# Patient Record
Sex: Female | Born: 1965 | Hispanic: No | Marital: Married | Smoking: Never smoker
Health system: Southern US, Community
[De-identification: ages and names within clinical notes are randomized; demographics above are authoritative.]

## PROBLEM LIST (undated history)

## (undated) DIAGNOSIS — E079 Disorder of thyroid, unspecified: Secondary | ICD-10-CM

## (undated) DIAGNOSIS — T7840XA Allergy, unspecified, initial encounter: Secondary | ICD-10-CM

## (undated) DIAGNOSIS — Z955 Presence of coronary angioplasty implant and graft: Secondary | ICD-10-CM

## (undated) DIAGNOSIS — I2510 Atherosclerotic heart disease of native coronary artery without angina pectoris: Secondary | ICD-10-CM

## (undated) HISTORY — DX: Presence of coronary angioplasty implant and graft: Z95.5

## (undated) HISTORY — DX: Allergy, unspecified, initial encounter: T78.40XA

## (undated) HISTORY — DX: Disorder of thyroid, unspecified: E07.9

---

## 1997-12-31 ENCOUNTER — Ambulatory Visit (HOSPITAL_COMMUNITY): Admission: RE | Admit: 1997-12-31 | Discharge: 1997-12-31 | Payer: Self-pay | Admitting: Obstetrics and Gynecology

## 1998-01-10 ENCOUNTER — Inpatient Hospital Stay (HOSPITAL_COMMUNITY): Admission: AD | Admit: 1998-01-10 | Discharge: 1998-01-10 | Payer: Self-pay | Admitting: *Deleted

## 1998-01-28 ENCOUNTER — Ambulatory Visit (HOSPITAL_COMMUNITY): Admission: RE | Admit: 1998-01-28 | Discharge: 1998-01-28 | Payer: Self-pay | Admitting: Obstetrics and Gynecology

## 1999-01-20 ENCOUNTER — Inpatient Hospital Stay (HOSPITAL_COMMUNITY): Admission: AD | Admit: 1999-01-20 | Discharge: 1999-01-23 | Payer: Self-pay | Admitting: Obstetrics and Gynecology

## 1999-01-25 ENCOUNTER — Encounter (HOSPITAL_COMMUNITY): Admission: RE | Admit: 1999-01-25 | Discharge: 1999-04-25 | Payer: Self-pay | Admitting: Obstetrics and Gynecology

## 1999-02-28 ENCOUNTER — Other Ambulatory Visit: Admission: RE | Admit: 1999-02-28 | Discharge: 1999-02-28 | Payer: Self-pay | Admitting: Obstetrics and Gynecology

## 2000-03-20 ENCOUNTER — Other Ambulatory Visit: Admission: RE | Admit: 2000-03-20 | Discharge: 2000-03-20 | Payer: Self-pay | Admitting: Obstetrics and Gynecology

## 2000-10-24 ENCOUNTER — Other Ambulatory Visit: Admission: RE | Admit: 2000-10-24 | Discharge: 2000-10-24 | Payer: Self-pay | Admitting: Obstetrics and Gynecology

## 2001-05-16 ENCOUNTER — Inpatient Hospital Stay (HOSPITAL_COMMUNITY): Admission: AD | Admit: 2001-05-16 | Discharge: 2001-05-18 | Payer: Self-pay | Admitting: Obstetrics and Gynecology

## 2001-06-11 ENCOUNTER — Other Ambulatory Visit: Admission: RE | Admit: 2001-06-11 | Discharge: 2001-06-11 | Payer: Self-pay | Admitting: Obstetrics and Gynecology

## 2011-10-20 DIAGNOSIS — E039 Hypothyroidism, unspecified: Secondary | ICD-10-CM

## 2011-10-20 HISTORY — DX: Hypothyroidism, unspecified: E03.9

## 2014-08-12 DIAGNOSIS — J3089 Other allergic rhinitis: Secondary | ICD-10-CM

## 2014-08-12 HISTORY — DX: Other allergic rhinitis: J30.89

## 2015-08-23 DIAGNOSIS — Z0000 Encounter for general adult medical examination without abnormal findings: Secondary | ICD-10-CM | POA: Insufficient documentation

## 2015-08-23 HISTORY — DX: Encounter for general adult medical examination without abnormal findings: Z00.00

## 2015-08-23 LAB — CBC AND DIFFERENTIAL
HCT: 37 (ref 36–46)
HEMOGLOBIN: 12.1 (ref 12.0–16.0)
PLATELETS: 236 (ref 150–399)
WBC: 6.1

## 2015-08-23 LAB — BASIC METABOLIC PANEL
BUN: 12 (ref 4–21)
Creatinine: 0.7 (ref 0.5–1.1)
Glucose: 87
POTASSIUM: 3.8 (ref 3.4–5.3)
Sodium: 137 (ref 137–147)

## 2015-08-23 LAB — HEPATIC FUNCTION PANEL
ALT: 22 (ref 7–35)
AST: 23 (ref 13–35)
Alkaline Phosphatase: 86 (ref 25–125)

## 2015-08-23 LAB — LIPID PANEL
CHOLESTEROL: 164 (ref 0–200)
HDL: 56 (ref 35–70)
LDL Cholesterol: 88
TRIGLYCERIDES: 100 (ref 40–160)

## 2015-08-23 LAB — TSH: TSH: 4.18 (ref 0.41–5.90)

## 2016-10-11 LAB — BASIC METABOLIC PANEL
BUN: 15 (ref 4–21)
Creatinine: 0.7 (ref 0.5–1.1)
GLUCOSE: 87
Potassium: 4.5 (ref 3.4–5.3)
SODIUM: 142 (ref 137–147)

## 2016-10-11 LAB — LIPID PANEL
Cholesterol: 188 (ref 0–200)
HDL: 60 (ref 35–70)
LDL CALC: 115
Triglycerides: 63 (ref 40–160)

## 2016-10-11 LAB — CBC AND DIFFERENTIAL
HEMATOCRIT: 39 (ref 36–46)
Hemoglobin: 12.9 (ref 12.0–16.0)
Platelets: 231 (ref 150–399)
WBC: 5.7

## 2016-10-11 LAB — HEPATIC FUNCTION PANEL
ALK PHOS: 82 (ref 25–125)
ALT: 15 (ref 7–35)
AST: 26 (ref 13–35)
BILIRUBIN, TOTAL: 0.2

## 2016-10-11 LAB — TSH: TSH: 9.22 — AB (ref 0.41–5.90)

## 2017-02-14 LAB — TSH: TSH: 3.32 (ref 0.41–5.90)

## 2017-07-23 ENCOUNTER — Ambulatory Visit: Payer: Federal, State, Local not specified - PPO | Admitting: Family Medicine

## 2017-07-23 ENCOUNTER — Encounter: Payer: Self-pay | Admitting: Family Medicine

## 2017-07-23 VITALS — BP 126/80 | HR 89 | Temp 97.9°F | Ht 61.0 in | Wt 168.2 lb

## 2017-07-23 DIAGNOSIS — Z0000 Encounter for general adult medical examination without abnormal findings: Secondary | ICD-10-CM | POA: Diagnosis not present

## 2017-07-23 DIAGNOSIS — J301 Allergic rhinitis due to pollen: Secondary | ICD-10-CM

## 2017-07-23 DIAGNOSIS — E039 Hypothyroidism, unspecified: Secondary | ICD-10-CM

## 2017-07-23 DIAGNOSIS — R0789 Other chest pain: Secondary | ICD-10-CM | POA: Insufficient documentation

## 2017-07-23 HISTORY — DX: Other chest pain: R07.89

## 2017-07-23 HISTORY — DX: Allergic rhinitis due to pollen: J30.1

## 2017-07-23 MED ORDER — PREDNISONE 20 MG PO TABS: 20.0000 mg | ORAL_TABLET | Freq: Two times a day (BID) | ORAL | 0 refills | Status: DC

## 2017-07-23 MED ORDER — FLUTICASONE PROPIONATE 50 MCG/ACT NA SUSP: 2.0000 | Freq: Every day | NASAL | 6 refills | Status: DC

## 2017-07-23 NOTE — Progress Notes (Signed)
Subjective:  Patient ID: Maria Myers, female    DOB: 08-11-1965  Age: 52 y.o. MRN: [id number]  CC: Establish Care   HPI [name] presents for establishment of care and for evaluation of itchy watery eyes, sneezing runny nose, postnasal drip and scratchy throat.  She has a history of allergy rhinitis.  It sounds as though she has taken desensitization therapy for this that has been helpful in the past.  He is nonfasting today.  She is married and has 3 children.  One child is grown and out of the house.  Her 50 year old daughter is meeting a lifestyle that is causing her stress.  Patient tells me that she has had nonexertional bouts of superior chest pressure lasting a few minutes.  These seem to be relieved when she meditates and practices yoga.  There has been no nausea or diaphoresis or exertional exacerbation.  She has no history of cardiac disease.  Both of her parents are in their 85s and still right their bicycles.  Patient does not smoke she is not exposed to cigarette smoke.  Chart review of her lipid profile drawn back in May of last year shows an LDL of 115 and an HDL of 60.  Patient works for the [employer].  She does not smoke or use illicit drugs.  She is planning on starting exercise soon.  Her female health care is through GYN.  She will need me to refer her to 1.  History Juliane has a past medical history of Allergy and Thyroid disease.   She has a past surgical history that includes Cesarean section.   Her family history includes Arthritis in her sister.She reports that  has never smoked. she has never used smokeless tobacco. She reports that she does not use drugs. Her alcohol history is not on file.  Outpatient Medications Prior to Visit  Medication Sig Dispense Refill  . levothyroxine (SYNTHROID, LEVOTHROID) 50 MCG tablet Take 1 tablet by mouth every morning.  11   No facility-administered medications prior to visit.     ROS Review of Systems  Constitutional: Negative  for chills, diaphoresis, fatigue, fever and unexpected weight change.  HENT: Positive for congestion, postnasal drip, rhinorrhea and sneezing. Negative for sinus pressure, sinus pain, sore throat and trouble swallowing.   Eyes: Positive for discharge and itching. Negative for photophobia and visual disturbance.  Respiratory: Negative.  Negative for chest tightness, shortness of breath and wheezing.   Cardiovascular: Negative for chest pain and palpitations.  Gastrointestinal: Negative.   Endocrine: Negative for polyphagia and polyuria.  Genitourinary: Negative.   Musculoskeletal: Negative for arthralgias and joint swelling.  Skin: Negative for color change and rash.  Allergic/Immunologic: Negative for immunocompromised state.  Neurological: Negative for weakness and headaches.  Hematological: Does not bruise/bleed easily.    Objective:  BP 126/80 (BP Location: Right Arm, Patient Position: Sitting, Cuff Size: Normal)   Pulse 89   Temp 97.9 F (36.6 C) (Oral)   Ht 5\' 1"  (1.549 m)   Wt 168 lb 4 oz (76.3 kg)   LMP  (LMP Unknown)   SpO2 98%   BMI 31.79 kg/m   Physical Exam  Constitutional: She is oriented to person, place, and time. She appears well-developed. No distress.  HENT:  Head: Normocephalic and atraumatic.  Right Ear: External ear normal.  Mouth/Throat: Oropharynx is clear and moist. No oropharyngeal exudate.  Eyes: Pupils are equal, round, and reactive to light. Right eye exhibits discharge. Right eye exhibits no chemosis. Left  eye exhibits discharge. Left eye exhibits no chemosis. No scleral icterus.    Neck: Neck supple. No JVD present. No tracheal deviation present. No thyromegaly present.  Cardiovascular: Normal rate, regular rhythm and normal heart sounds.  Pulmonary/Chest: Effort normal and breath sounds normal. No respiratory distress. She has no wheezes. She has no rales.  Lymphadenopathy:    She has no cervical adenopathy.  Neurological: She is alert and  oriented to person, place, and time.  Skin: Skin is warm and dry. No rash noted. She is not diaphoretic.  Psychiatric: She has a normal mood and affect. Her behavior is normal.      Assessment & Plan:   Veeda was seen today for establish care.  Diagnoses and all orders for this visit:  Chest pressure -     EKG 12-Lead -     CBC; Future -     Comprehensive metabolic panel; Future  Seasonal allergic rhinitis due to pollen -     predniSONE (DELTASONE) 20 MG tablet; Take 1 tablet (20 mg total) by mouth 2 (two) times daily with a meal for 7 days. -     fluticasone (FLONASE) 50 MCG/ACT nasal spray; Place 2 sprays into both nostrils daily. -     CBC; Future -     Comprehensive metabolic panel; Future  Acquired hypothyroidism -     Lipid panel; Future -     TSH; Future  Healthcare maintenance -     CBC; Future -     Comprehensive metabolic panel; Future -     HIV antibody; Future -     Lipid panel; Future -     Urinalysis, Routine w reflex microscopic; Future  Patient presents today with symptoms consistent with allergy rhinitis.  The chest pressure she has reported is atypical.  She is not high risk for cardiovascular disease with her known lipid profile and her history.  She will resume in the next few days for fasting labs.  I will soon see her back in a few weeks to reassess further.  Her EKG was showed one flipped T and was otherwise nonspecific. I am having Tabrina Gora start on predniSONE and fluticasone. I am also having her maintain her levothyroxine.  Meds ordered this encounter  Medications  . predniSONE (DELTASONE) 20 MG tablet    Sig: Take 1 tablet (20 mg total) by mouth 2 (two) times daily with a meal for 7 days.    Dispense:  14 tablet    Refill:  0  . fluticasone (FLONASE) 50 MCG/ACT nasal spray    Sig: Place 2 sprays into both nostrils daily.    Dispense:  16 g    Refill:  6     Follow-up: Return in about 2 weeks (around 08/06/2017), or if symptoms worsen or  fail to improve.  Mliss SaxWilliam Alfred Afia Messenger, MD

## 2017-07-23 NOTE — Patient Instructions (Addendum)

## 2017-07-27 ENCOUNTER — Emergency Department (HOSPITAL_COMMUNITY): Payer: Federal, State, Local not specified - PPO

## 2017-07-27 ENCOUNTER — Emergency Department (HOSPITAL_COMMUNITY)
Admission: EM | Admit: 2017-07-27 | Discharge: 2017-07-27 | Disposition: A | Discharge status: Home / Self Care | Payer: Federal, State, Local not specified - PPO | Attending: Emergency Medicine | Admitting: Emergency Medicine

## 2017-07-27 ENCOUNTER — Encounter (HOSPITAL_COMMUNITY): Payer: Self-pay | Admitting: *Deleted

## 2017-07-27 ENCOUNTER — Other Ambulatory Visit: Payer: Self-pay

## 2017-07-27 DIAGNOSIS — M542 Cervicalgia: Secondary | ICD-10-CM | POA: Insufficient documentation

## 2017-07-27 DIAGNOSIS — Z79899 Other long term (current) drug therapy: Secondary | ICD-10-CM | POA: Insufficient documentation

## 2017-07-27 DIAGNOSIS — R079 Chest pain, unspecified: Secondary | ICD-10-CM | POA: Diagnosis present

## 2017-07-27 DIAGNOSIS — J069 Acute upper respiratory infection, unspecified: Secondary | ICD-10-CM | POA: Diagnosis not present

## 2017-07-27 LAB — CBC
HEMATOCRIT: 39.8 % (ref 36.0–46.0)
HEMOGLOBIN: 12.9 g/dL (ref 12.0–15.0)
MCH: 27.8 pg (ref 26.0–34.0)
MCHC: 32.4 g/dL (ref 30.0–36.0)
MCV: 85.8 fL (ref 78.0–100.0)
Platelets: 233 10*3/uL (ref 150–400)
RBC: 4.64 MIL/uL (ref 3.87–5.11)
RDW: 13.5 % (ref 11.5–15.5)
WBC: 7.5 10*3/uL (ref 4.0–10.5)

## 2017-07-27 LAB — BASIC METABOLIC PANEL
Anion gap: 11 (ref 5–15)
BUN: 13 mg/dL (ref 6–20)
CHLORIDE: 103 mmol/L (ref 101–111)
CO2: 24 mmol/L (ref 22–32)
Calcium: 8.9 mg/dL (ref 8.9–10.3)
Creatinine, Ser: 0.73 mg/dL (ref 0.44–1.00)
GFR calc non Af Amer: >60 mL/min (ref >60)
Glucose, Bld: 116 mg/dL — ABNORMAL HIGH (ref 65–99)
POTASSIUM: 3.5 mmol/L (ref 3.5–5.1)
Sodium: 138 mmol/L (ref 135–145)

## 2017-07-27 LAB — I-STAT BETA HCG BLOOD, ED (MC, WL, AP ONLY)

## 2017-07-27 LAB — I-STAT TROPONIN, ED: Troponin i, poc: 0.01 ng/mL (ref 0.00–0.08)

## 2017-07-27 MED ORDER — AMOXICILLIN 500 MG PO CAPS: 500.0000 mg | ORAL_CAPSULE | Freq: Three times a day (TID) | ORAL | 0 refills | Status: DC

## 2017-07-27 MED ORDER — ALBUTEROL SULFATE HFA 108 (90 BASE) MCG/ACT IN AERS: 2.0000 | INHALATION_SPRAY | RESPIRATORY_TRACT | 1 refills | Status: AC | PRN

## 2017-07-27 MED ORDER — IPRATROPIUM-ALBUTEROL 0.5-2.5 (3) MG/3ML IN SOLN
3.0000 mL | Freq: Once | RESPIRATORY_TRACT | Status: AC
  Administered 2017-07-27: 3 mL via RESPIRATORY_TRACT
  Filled 2017-07-27: qty 3

## 2017-07-27 MED ORDER — PREDNISONE 50 MG PO TABS: ORAL_TABLET | ORAL | 0 refills | Status: DC

## 2017-07-27 NOTE — ED Triage Notes (Signed)
Pt in c/o central chest pain that radiates into her neck, intermittent over the last few days, increased episodes this morning, states it is relieved by rest and relaxing, no distress at this time

## 2017-07-27 NOTE — ED Provider Notes (Signed)
[facility] EMERGENCY DEPARTMENT Provider Note   CSN: [id number] Arrival date & time: 07/27/17  4098     History   Chief Complaint Chief Complaint  Patient presents with  . Chest Pain    HPI Maria Myers is a 52 y.o. female.  The history is provided by the patient. No language interpreter was used.  Chest Pain   This is a new problem. The current episode started more than 2 days ago. The problem occurs constantly. The problem has not changed since onset.The pain is associated with rest. The pain is moderate. The pain radiates to the left neck and right neck. Pertinent negatives include no cough, no headaches and no nausea. She has tried nothing for the symptoms. The treatment provided no relief. There are no known risk factors.  Her past medical history is significant for thyroid problem.  Procedure history is negative for cardiac catheterization.  Pt complains of tightness in her upper chest and her neck.  Pt reports neck becomes very tight.  Pt reports upper chest feels tight.  Pt reports symptoms improve with rest.  Pt reports she has had sinus congestion and pressure.   Past Medical History:  Diagnosis Date  . Allergy   . Thyroid disease     Patient Active Problem List   Diagnosis Date Noted  . Chest pressure 07/23/2017  . Seasonal allergic rhinitis due to pollen 07/23/2017    Past Surgical History:  Procedure Laterality Date  . CESAREAN SECTION      OB History    No data available       Home Medications    Prior to Admission medications   Medication Sig Start Date End Date Taking? Authorizing Provider  fluticasone (FLONASE) 50 MCG/ACT nasal spray Place 2 sprays into both nostrils daily. 07/23/17  Yes Mliss Sax, MD  levothyroxine (SYNTHROID, LEVOTHROID) 50 MCG tablet Take 1 tablet by mouth every morning. 04/30/17  Yes [provider]  Multiple Vitamin (MULTIVITAMIN WITH MINERALS) TABS tablet Take 1 tablet by mouth daily.    Yes [provider]  predniSONE (DELTASONE) 20 MG tablet Take 1 tablet (20 mg total) by mouth 2 (two) times daily with a meal for 7 days. 07/23/17 07/30/17 Yes Mliss Sax, MD    Family History Family History  Problem Relation Age of Onset  . Arthritis Sister     Social History Social History   Tobacco Use  . Smoking status: Never Smoker  . Smokeless tobacco: Never Used  Substance Use Topics  . Alcohol use: Not on file  . Drug use: No     Allergies   Patient has no known allergies.   Review of Systems Review of Systems  Respiratory: Negative for cough.   Cardiovascular: Positive for chest pain.  Gastrointestinal: Negative for nausea.  Neurological: Negative for headaches.  All other systems reviewed and are negative.    Physical Exam Updated Vital Signs BP (!) 149/67 (BP Location: Right Arm)   Pulse 62   Temp 97.6 F (36.4 C) (Oral)   Resp 16   LMP  (LMP Unknown)   SpO2 100%   Physical Exam  Constitutional: She appears well-developed and well-nourished. No distress.  HENT:  Head: Normocephalic and atraumatic.  Eyes: Conjunctivae are normal. Pupils are equal, round, and reactive to light.  Neck: Neck supple.  Cardiovascular: Normal rate, regular rhythm and normal pulses.  No murmur heard. Pulmonary/Chest: Effort normal and breath sounds normal. No respiratory distress.  Abdominal: Soft.  There is no tenderness.  Musculoskeletal: She exhibits no edema.  Neurological: She is alert.  Skin: Skin is warm and dry.  Psychiatric: She has a normal mood and affect.  Nursing note and vitals reviewed.    ED Treatments / Results  Labs (all labs ordered are listed, but only abnormal results are displayed) Labs Reviewed  BASIC METABOLIC PANEL - Abnormal; Notable for the following components:      Result Value   Glucose, Bld 116 (*)    All other components within normal limits  CBC  I-STAT TROPONIN, ED  I-STAT BETA HCG BLOOD, ED ([facility], [facility], [facility]  ONLY)    EKG  EKG Interpretation  Date/Time:  Friday July 27 2017 08:33:39 EST Ventricular Rate:  63 PR Interval:  146 QRS Duration: 100 QT Interval:  388 QTC Calculation: 397 R Axis:   30 Text Interpretation:  Normal sinus rhythm Nonspecific T wave abnormality Abnormal ECG Confirmed by Jacalyn LefevreHaviland, Julie [phone]([id number]) on 07/27/2017 10:00:01 AM       Radiology Dg Chest 2 View  Result Date: 07/27/2017 CLINICAL DATA:  Intermittent chest pain for a few days. EXAM: CHEST  2 VIEW COMPARISON:  None. FINDINGS: The cardiomediastinal silhouette is within normal limits. The lungs are well inflated and clear. There is no evidence of pleural effusion or pneumothorax. No acute osseous abnormality is identified. IMPRESSION: No active cardiopulmonary disease. Electronically Signed   By: Sebastian AcheAllen  Grady M.D.   On: 07/27/2017 09:09    Procedures Procedures (including critical care time)  Medications Ordered in ED Medications  ipratropium-albuterol (DUONEB) 0.5-2.5 (3) MG/3ML nebulizer solution 3 mL (not administered)     Initial Impression / Assessment and Plan / ED Course  I have reviewed the triage vital signs and the nursing notes.  Pertinent labs & imaging results that were available during my care of the patient were reviewed by me and considered in my medical decision making (see chart for details).  Clinical Course as of Jul 27 1025  Fri Jul 27, 2017  0907 ED EKG within 10 minutes [LS]  0907 ED EKG within 10 minutes [LS]    Clinical Course User Index [LS] Elson AreasSofia, Leslie K, PA-C    MDM  Pt reports some improvement with albuterol/atrovent.  Pt reports tightness resolved.  I discussed with Dr. Particia NearingHaviland.  I will treat with prednisone, albuterol.  I am going to treat as well for possible sinus infection  Final Clinical Impressions(s) / ED Diagnoses   Final diagnoses:  Upper respiratory tract infection, unspecified type  Nonspecific chest pain    ED Discharge Orders        Ordered     albuterol (PROVENTIL HFA;VENTOLIN HFA) 108 (90 Base) MCG/ACT inhaler  Every 4 hours PRN     07/27/17 1147    predniSONE (DELTASONE) 50 MG tablet     07/27/17 1147    amoxicillin (AMOXIL) 500 MG capsule  3 times daily     07/27/17 1147    An After Visit Summary was printed and given to the patient.    Elson AreasSofia, Leslie K, [place]-C 07/27/17 1147    Jacalyn LefevreHaviland, Julie, MD 07/27/17 1159

## 2017-07-27 NOTE — ED Notes (Signed)
Pt verbalized understanding discharge instructions and denies any further needs or questions at this time. VS stable, ambulatory and steady gait.   

## 2017-07-27 NOTE — Discharge Instructions (Signed)
Return if any problems.  See your Physician for recheck.  Return if any problems.  

## 2017-07-27 NOTE — ED Triage Notes (Signed)
Pt describes pain as a pressure, states she feels pressure in her neck and head as well

## 2017-08-01 ENCOUNTER — Encounter: Payer: Self-pay | Admitting: Family Medicine

## 2017-08-01 ENCOUNTER — Ambulatory Visit: Payer: Federal, State, Local not specified - PPO | Admitting: Family Medicine

## 2017-08-01 VITALS — BP 130/70 | HR 49 | Wt 166.2 lb

## 2017-08-01 DIAGNOSIS — K219 Gastro-esophageal reflux disease without esophagitis: Secondary | ICD-10-CM

## 2017-08-01 DIAGNOSIS — E039 Hypothyroidism, unspecified: Secondary | ICD-10-CM | POA: Diagnosis not present

## 2017-08-01 DIAGNOSIS — R221 Localized swelling, mass and lump, neck: Secondary | ICD-10-CM | POA: Diagnosis not present

## 2017-08-01 DIAGNOSIS — Z0000 Encounter for general adult medical examination without abnormal findings: Secondary | ICD-10-CM

## 2017-08-01 DIAGNOSIS — R0789 Other chest pain: Secondary | ICD-10-CM | POA: Diagnosis not present

## 2017-08-01 DIAGNOSIS — J301 Allergic rhinitis due to pollen: Secondary | ICD-10-CM

## 2017-08-01 DIAGNOSIS — F341 Dysthymic disorder: Secondary | ICD-10-CM

## 2017-08-01 HISTORY — DX: Dysthymic disorder: F34.1

## 2017-08-01 HISTORY — DX: Localized swelling, mass and lump, neck: R22.1

## 2017-08-01 LAB — COMPREHENSIVE METABOLIC PANEL
ALT: 20 U/L (ref 0–35)
AST: 16 U/L (ref 0–37)
Albumin: 3.7 g/dL (ref 3.5–5.2)
Alkaline Phosphatase: 70 U/L (ref 39–117)
BILIRUBIN TOTAL: 0.4 mg/dL (ref 0.2–1.2)
BUN: 16 mg/dL (ref 6–23)
CO2: 29 meq/L (ref 19–32)
CREATININE: 0.7 mg/dL (ref 0.40–1.20)
Calcium: 8.9 mg/dL (ref 8.4–10.5)
Chloride: 101 mEq/L (ref 96–112)
GFR: 93.38 mL/min (ref >60.00)
GLUCOSE: 80 mg/dL (ref 70–99)
Potassium: 3.5 mEq/L (ref 3.5–5.1)
SODIUM: 137 meq/L (ref 135–145)
Total Protein: 6.8 g/dL (ref 6.0–8.3)

## 2017-08-01 LAB — CBC
HCT: 38.7 % (ref 36.0–46.0)
Hemoglobin: 13 g/dL (ref 12.0–15.0)
MCHC: 33.6 g/dL (ref 30.0–36.0)
MCV: 84.7 fl (ref 78.0–100.0)
PLATELETS: 217 10*3/uL (ref 150.0–400.0)
RBC: 4.57 Mil/uL (ref 3.87–5.11)
RDW: 13.8 % (ref 11.5–15.5)
WBC: 7.9 10*3/uL (ref 4.0–10.5)

## 2017-08-01 LAB — URINALYSIS, ROUTINE W REFLEX MICROSCOPIC
BILIRUBIN URINE: NEGATIVE
HGB URINE DIPSTICK: NEGATIVE
Ketones, ur: NEGATIVE
NITRITE: NEGATIVE
RBC / HPF: NONE SEEN (ref 0–?)
Specific Gravity, Urine: <=1.005 — AB (ref 1.000–1.030)
TOTAL PROTEIN, URINE-UPE24: NEGATIVE
URINE GLUCOSE: NEGATIVE
Urobilinogen, UA: 0.2 (ref 0.0–1.0)
pH: 6 (ref 5.0–8.0)

## 2017-08-01 LAB — LIPID PANEL
CHOL/HDL RATIO: 3
Cholesterol: 169 mg/dL (ref 0–200)
HDL: 62.3 mg/dL (ref >39.00)
LDL Cholesterol: 77 mg/dL (ref 0–99)
NonHDL: 106.55
Triglycerides: 147 mg/dL (ref 0.0–149.0)
VLDL: 29.4 mg/dL (ref 0.0–40.0)

## 2017-08-01 LAB — TSH: TSH: 7.62 u[IU]/mL — ABNORMAL HIGH (ref 0.35–4.50)

## 2017-08-01 MED ORDER — OMEPRAZOLE 20 MG PO TBDD
1.0000 | DELAYED_RELEASE_TABLET | Freq: Every day | ORAL | 2 refills | Status: DC
Start: 2017-08-01 — End: 2018-01-04

## 2017-08-01 NOTE — Progress Notes (Addendum)
Subjective:  Patient ID: Maria Myers, female    DOB: [birth year]  Age: 52 y.o. MRN: [id number]  CC: Hospitalization Follow-up   HPI Euretha Sardo presents for hospital follow-up status post ER visit or asthmatic bronchitis.  She was given albuterol inhaler, amoxicillin and continued on prednisone.  EKG taken at that time showed the same inverted T wave in the lateral chest.  Her troponin was negative.  Patient continues to feel tightness in her throat.  She is concerned about thyroid cancer.  No masses in her neck.  That are tender from time to time.  She notices some swelling in her throat when she eats apples.  She does admit to having some indigestion and is currently taking nothing for it.  Above medicines will help her cough.  Last visit she mentioned concerned about her oldest daughter.  I asked her today what was going on there.  Apparently this daughter had gone off to college and got involved with what sounds while alcohol and illicit drugs.  She did not finish college after 15 semesters.  She dropped out.  She has a living away from home and my patient has no idea how her daughter making her way.  She does not appear to be employed.  The patient well up in the tearful when she talked about this daughter.  Patient is fasting today for blood work.  She also talks about cold intolerance and difficulty losing weight.  I am not certain when her TSH was last checked.  She does claim compliance with her thyroid medicine.  Outpatient Medications Prior to Visit  Medication Sig Dispense Refill  . albuterol (PROVENTIL HFA;VENTOLIN HFA) 108 (90 Base) MCG/ACT inhaler Inhale 2 puffs into the lungs every 4 (four) hours as needed for wheezing or shortness of breath. 1 Inhaler 1  . amoxicillin (AMOXIL) 500 MG capsule Take 1 capsule (500 mg total) by mouth 3 (three) times daily. 30 capsule 0  . fluticasone (FLONASE) 50 MCG/ACT nasal spray Place 2 sprays into both nostrils daily. 16 g 6  . Multiple Vitamin  (MULTIVITAMIN WITH MINERALS) TABS tablet Take 1 tablet by mouth daily.    . predniSONE (DELTASONE) 50 MG tablet One  A day 5 tablet 0  . levothyroxine (SYNTHROID, LEVOTHROID) 50 MCG tablet Take 1 tablet by mouth every morning.  11   No facility-administered medications prior to visit.     ROS Review of Systems  Constitutional: Positive for fatigue. Negative for chills, fever and unexpected weight change.  HENT: Positive for congestion, postnasal drip, rhinorrhea, sneezing and sore throat. Negative for sinus pressure, sinus pain, trouble swallowing and voice change.   Eyes: Negative for photophobia and visual disturbance.  Respiratory: Positive for cough. Negative for chest tightness, shortness of breath and wheezing.   Cardiovascular: Negative for chest pain and palpitations.  Gastrointestinal: Negative.   Endocrine: Negative for polyphagia and polyuria.  Genitourinary: Negative.   Musculoskeletal: Negative for gait problem and myalgias.  Skin: Negative for rash and wound.  Allergic/Immunologic: Negative for immunocompromised state.  Neurological: Negative for light-headedness and headaches.  Hematological: Does not bruise/bleed easily.  Psychiatric/Behavioral: Positive for dysphoric mood. Negative for agitation, self-injury and suicidal ideas. The patient is nervous/anxious.     Objective:  BP 130/70 (BP Location: Right Arm, Patient Position: Sitting, Cuff Size: Normal)   Pulse (!) 49   Wt 166 lb 3.2 oz (75.4 kg)   LMP  (LMP Unknown)   BMI 31.40 kg/m   BP Readings from Last  3 Encounters:  08/01/17 130/70  07/27/17 118/67  07/23/17 126/80    Wt Readings from Last 3 Encounters:  08/01/17 166 lb 3.2 oz (75.4 kg)  07/23/17 168 lb 4 oz (76.3 kg)    Physical Exam  Constitutional: She is oriented to person, place, and time. She appears well-developed and well-nourished. No distress.  HENT:  Head: Normocephalic and atraumatic.  Right Ear: External ear normal.  Left Ear:  External ear normal.  Mouth/Throat: Uvula is midline, oropharynx is clear and moist and mucous membranes are normal. No oropharyngeal exudate, posterior oropharyngeal edema, posterior oropharyngeal erythema or tonsillar abscesses.    Eyes: Conjunctivae are normal. Pupils are equal, round, and reactive to light. Right eye exhibits no discharge. Left eye exhibits no discharge. No scleral icterus.  Neck: Neck supple. No JVD present. No tracheal deviation present. No thyroid mass and no thyromegaly present.    Cardiovascular: Normal rate, regular rhythm and normal heart sounds.  Pulmonary/Chest: Effort normal and breath sounds normal. No stridor.  Abdominal: Bowel sounds are normal.  Lymphadenopathy:    She has no cervical adenopathy.  Neurological: She is alert and oriented to person, place, and time.  Skin: Skin is warm and dry. She is not diaphoretic.  Psychiatric: She has a normal mood and affect. Her behavior is normal.    Lab Results  Component Value Date   WBC 7.9 08/01/2017   HGB 13.0 08/01/2017   HCT 38.7 08/01/2017   PLT 217.0 08/01/2017   GLUCOSE 80 08/01/2017   CHOL 169 08/01/2017   TRIG 147.0 08/01/2017   HDL 62.30 08/01/2017   LDLCALC 77 08/01/2017   ALT 20 08/01/2017   AST 16 08/01/2017   NA 137 08/01/2017   K 3.5 08/01/2017   CL 101 08/01/2017   CREATININE 0.70 08/01/2017   BUN 16 08/01/2017   CO2 29 08/01/2017   TSH 7.62 (H) 08/01/2017    Dg Chest 2 View  Result Date: 07/27/2017 CLINICAL DATA:  Intermittent chest pain for a few days. EXAM: CHEST  2 VIEW COMPARISON:  None. FINDINGS: The cardiomediastinal silhouette is within normal limits. The lungs are well inflated and clear. There is no evidence of pleural effusion or pneumothorax. No acute osseous abnormality is identified. IMPRESSION: No active cardiopulmonary disease. Electronically Signed   By: Sebastian Ache M.D.   On: 07/27/2017 09:09   Depression screen PHQ 2/9 08/01/2017  Down, Depressed, Hopeless 3    PHQ - 2 Score 3  Altered sleeping 0  Tired, decreased energy 2  Change in appetite 3  Feeling bad or failure about yourself  1  Trouble concentrating 0  Moving slowly or fidgety/restless 1  Suicidal thoughts 0  PHQ-9 Score 10    Assessment & Plan:   Georgeanne was seen today for hospitalization follow-up.  Diagnoses and all orders for this visit:  Seasonal allergic rhinitis due to pollen -     Comprehensive metabolic panel -     CBC  Dysthymia -     Ambulatory referral to Psychology  Mass of neck -     CT Soft Tissue Neck W Contrast; Future  Localized swelling, mass or lump of neck -     CT Soft Tissue Neck W Contrast; Future  Gastroesophageal reflux disease, esophagitis presence not specified -     Omeprazole 20 MG TBDD; Take 1 tablet by mouth daily.  Healthcare maintenance -     Urinalysis, Routine w reflex microscopic -     Lipid panel -  HIV antibody -     Comprehensive metabolic panel -     CBC  Acquired hypothyroidism -     TSH -     Lipid panel -     levothyroxine (SYNTHROID, LEVOTHROID) 75 MCG tablet; Take 1 tablet (75 mcg total) by mouth daily before breakfast.  Chest pressure -     Lipid panel -     Comprehensive metabolic panel -     CBC   I have discontinued Sherryn Gales's levothyroxine. I am also having her start on Omeprazole and levothyroxine. Additionally, I am having her maintain her fluticasone, multivitamin with minerals, albuterol, predniSONE, and amoxicillin.  Meds ordered this encounter  Medications  . Omeprazole 20 MG TBDD    Sig: Take 1 tablet by mouth daily.    Dispense:  30 tablet    Refill:  2  . levothyroxine (SYNTHROID, LEVOTHROID) 75 MCG tablet    Sig: Take 1 tablet (75 mcg total) by mouth daily before breakfast.    Dispense:  90 tablet    Refill:  0   Patient is a stress test rule believed to be a prodigal daughter.  She does have prominent submandibular bilateral masses.  She is having GERD.  I have started her on  omeprazole and have ordered a soft tissue CT of her neck.  She is having her blood work drawn today.  We will be able to see her thyroid status with return of labs.  I have asked for follow-up in 1 month.  Follow-up: Return in about 1 month (around 08/29/2017).  Mliss SaxWilliam Alfred Kremer, MD

## 2017-08-01 NOTE — Patient Instructions (Addendum)
Allergic Rhinitis, Adult Allergic rhinitis is an allergic reaction that affects the mucous membrane inside the nose. It causes sneezing, a runny or stuffy nose, and the feeling of mucus going down the back of the throat (postnasal drip). Allergic rhinitis can be mild to severe. There are two types of allergic rhinitis:  Seasonal. This type is also called hay fever. It happens only during certain seasons.  Perennial. This type can happen at any time of the year.  What are the causes? This condition happens when the body's defense system (immune system) responds to certain harmless substances called allergens as though they were germs.  Seasonal allergic rhinitis is triggered by pollen, which can come from grasses, trees, and weeds. Perennial allergic rhinitis may be caused by:  House dust mites.  Pet dander.  Mold spores.  What are the signs or symptoms? Symptoms of this condition include:  Sneezing.  Runny or stuffy nose (nasal congestion).  Postnasal drip.  Itchy nose.  Tearing of the eyes.  Trouble sleeping.  Daytime sleepiness.  How is this diagnosed? This condition may be diagnosed based on:  Your medical history.  A physical exam.  Tests to check for related conditions, such as: ? Asthma. ? Pink eye. ? Ear infection. ? Upper respiratory infection.  Tests to find out which allergens trigger your symptoms. These may include skin or blood tests.  How is this treated? There is no cure for this condition, but treatment can help control symptoms. Treatment may include:  Taking medicines that block allergy symptoms, such as antihistamines. Medicine may be given as a shot, nasal spray, or pill.  Avoiding the allergen.  Desensitization. This treatment involves getting ongoing shots until your body becomes less sensitive to the allergen. This treatment may be done if other treatments do not help.  If taking medicine and avoiding the allergen does not work, new,  stronger medicines may be prescribed.  Follow these instructions at home:  Find out what you are allergic to. Common allergens include smoke, dust, and pollen.  Avoid the things you are allergic to. These are some things you can do to help avoid allergens: ? Replace carpet with wood, tile, or vinyl flooring. Carpet can trap dander and dust. ? Do not smoke. Do not allow smoking in your home. ? Change your heating and air conditioning filter at least once a month. ? During allergy season:  Keep windows closed as much as possible.  Plan outdoor activities when pollen counts are lowest. This is usually during the evening hours.  When coming indoors, change clothing and shower before sitting on furniture or bedding.  Take over-the-counter and prescription medicines only as told by your health care provider.  Keep all follow-up visits as told by your health care provider. This is important. Contact a health care provider if:  You have a fever.  You develop a persistent cough.  You make whistling sounds when you breathe (you wheeze).  Your symptoms interfere with your normal daily activities. Get help right away if:  You have shortness of breath. Summary  This condition can be managed by taking medicines as directed and avoiding allergens.  Contact your health care provider if you develop a persistent cough or fever.  During allergy season, keep windows closed as much as possible. This information is not intended to replace advice given to you by your health care provider. Make sure you discuss any questions you have with your health care provider. Document Released: 02/14/2001 Document Revised: 06/29/2016  Document Reviewed: 06/29/2016 Elsevier Interactive Patient Education  2018 [employer].  Persistent Depressive Disorder, Adult Persistent depressive disorder (PDD) is a mental health condition that causes symptoms of low-level depression for 2 years or longer. It may also be  called long-term (chronic) depression or dysthymia. PDD may include episodes of more severe depression that last for about 2 weeks (major depressive disorder or MDD). PDD can affect the way you think, feel, and sleep. This condition may also affect your relationships. You may be more likely to get sick if you have PDD. What are the causes? The exact cause of this condition is not known. PDD is most likely caused by a combination of things, which may include:  Genetic factors. These are traits that are passed along from parent to child.  Individual factors. Your personality, your behavior, and the way you handle your thoughts and feelings may contribute to PDD. This includes personality traits and behaviors learned from others.  Physical factors, such as: ? Differences in the part of your brain that controls emotion. This part of your brain may be different than it is in people who do not have PDD. ? Long-term (chronic) medical or psychiatric illnesses.  Social factors. Traumatic experiences or major life changes may play a role in the development of PDD.  What increases the risk? This condition is more likely to develop in women. The following factors may make you more likely to develop PDD:  A family history of depression.  Abnormally low levels of certain brain chemicals.  Traumatic events in childhood, especially abuse or the loss of a parent.  Being under a lot of stress, or long-term stress, especially from upsetting life experiences or losses.  A history of: ? Chronic physical illness. ? Other mental health disorders. ? Substance abuse.  Poor living conditions.  Experiencing social exclusion or discrimination on a regular basis.  What are the signs or symptoms? Symptoms of this condition occur for most of the day, and may include:  Fatigue or low energy.  Eating too much or too little.  Sleeping too much or too little.  Restlessness or agitation.  Feelings of  hopelessness.  Feeling worthless or guilty.  Anxiety.  Poor concentration or difficulty making decisions.  Low self-esteem.  Negative outlook.  Inability to have fun or experience pleasure.  Social withdrawal.  Unexplained physical complaints.  Irritability.  Aggressive behavior or anger.  How is this diagnosed? This condition may be diagnosed based on:  Your symptoms.  Your medical history, including your mental health history. This may involve tests to evaluate your mental health. You may be asked questions about your lifestyle, including any drug and alcohol use, and how long you have had symptoms of PDD.  A physical exam.  Blood tests to rule out other conditions.  You may be diagnosed with PDD if you have had a depressed mood for 2 years or longer, as well as other symptoms of depression. How is this treated? This condition is usually treated by mental health professionals, such as psychologists, psychiatrists, and clinical social workers. You may need more than one type of treatment. Treatment may include:  Psychotherapy. This is also called talk therapy or counseling. Types of psychotherapy include: ? Cognitive behavioral therapy (CBT). This type of therapy teaches you to recognize unhealthy feelings, thoughts, and behaviors, and replace them with positive thoughts and actions. ? Interpersonal therapy (IPT). This helps you to improve the way you relate to and communicate with others. ? Family therapy.  This treatment includes members of your family.  Medicine to treat anxiety and depression, or to help you control certain emotions and behaviors.  Lifestyle changes, such as: ? Limiting alcohol and drug use. ? Exercising regularly. ? Getting plenty of sleep. ? Making healthy eating choices. ? Spending more time outdoors.  Follow these instructions at home: Activity  Return to your normal activities as told by your health care provider.  Exercise regularly  and spend time outdoors as told by your health care provider. General instructions  Take over-the-counter and prescription medicines only as told by your health care provider.  Do not drink alcohol. If you drink alcohol, limit your alcohol intake to no more than 1 drink a day for nonpregnant women and 2 drinks a day for men. One drink equals 12 oz of beer, 5 oz of wine, or 1 oz of hard liquor. Alcohol can affect any antidepressant medicines you are taking. Talk to your health care provider about your alcohol use.  Eat a healthy diet and get plenty of sleep.  Find activities that you enjoy doing, and make time to do them.  Consider joining a support group. Your health care provider may be able to recommend a support group.  Keep all follow-up visits as told by your health care provider. This is important. Where to find more information: [employer]ness  www.nami.org  [employer]  http://www.maynard.net/www.nimh.nih.gov  National Suicide Prevention Lifeline  1-800-273-TALK (956) 243-0923(8255). This is free, 24-hour help.  Contact a health care provider if:  Your symptoms get worse.  You develop new symptoms.  You have trouble sleeping or doing your daily activities. Get help right away if:  You self-harm.  You have serious thoughts about hurting yourself or others.  You see, hear, taste, smell, or feel things that are not present (hallucinate). This information is not intended to replace advice given to you by your health care provider. Make sure you discuss any questions you have with your health care provider. Document Released: 05/08/2012 Document Revised: 01/20/2016 Document Reviewed: 12/04/2015 Elsevier Interactive Patient Education  2018 [employer]  Gastroesophageal Reflux Disease, Adult Normally, food travels down the esophagus and stays in the stomach to be digested. However, when a person has gastroesophageal reflux disease (GERD), food and  stomach acid move back up into the esophagus. When this happens, the esophagus becomes sore and inflamed. Over time, GERD can create small holes (ulcers) in the lining of the esophagus. What are the causes? This condition is caused by a problem with the muscle between the esophagus and the stomach (lower esophageal sphincter, or LES). Normally, the LES muscle closes after food passes through the esophagus to the stomach. When the LES is weakened or abnormal, it does not close properly, and that allows food and stomach acid to go back up into the esophagus. The LES can be weakened by certain dietary substances, medicines, and medical conditions, including:  Tobacco use.  Pregnancy.  Having a hiatal hernia.  Heavy alcohol use.  Certain foods and beverages, such as coffee, chocolate, onions, and peppermint.  What increases the risk? This condition is more likely to develop in:  People who have an increased body weight.  People who have connective tissue disorders.  People who use NSAID medicines.  What are the signs or symptoms? Symptoms of this condition include:  Heartburn.  Difficult or painful swallowing.  The feeling of having a lump in the throat.  Abitter taste in the  mouth.  Bad breath.  Having a large amount of saliva.  Having an upset or bloated stomach.  Belching.  Chest pain.  Shortness of breath or wheezing.  Ongoing (chronic) cough or a night-time cough.  Wearing away of tooth enamel.  Weight loss.  Different conditions can cause chest pain. Make sure to see your health care provider if you experience chest pain. How is this diagnosed? Your health care provider will take a medical history and perform a physical exam. To determine if you have mild or severe GERD, your health care provider may also monitor how you respond to treatment. You may also have other tests, including:  An endoscopy toexamine your stomach and esophagus with a small  camera.  A test thatmeasures the acidity level in your esophagus.  A test thatmeasures how much pressure is on your esophagus.  A barium swallow or modified barium swallow to show the shape, size, and functioning of your esophagus.  How is this treated? The goal of treatment is to help relieve your symptoms and to prevent complications. Treatment for this condition may vary depending on how severe your symptoms are. Your health care provider may recommend:  Changes to your diet.  Medicine.  Surgery.  Follow these instructions at home: Diet  Follow a diet as recommended by your health care provider. This may involve avoiding foods and drinks such as: ? Coffee and tea (with or without caffeine). ? Drinks that containalcohol. ? Energy drinks and sports drinks. ? Carbonated drinks or sodas. ? Chocolate and cocoa. ? Peppermint and mint flavorings. ? Garlic and onions. ? Horseradish. ? Spicy and acidic foods, including peppers, chili powder, curry powder, vinegar, hot sauces, and barbecue sauce. ? Citrus fruit juices and citrus fruits, such as oranges, lemons, and limes. ? Tomato-based foods, such as red sauce, chili, salsa, and pizza with red sauce. ? Fried and fatty foods, such as donuts, french fries, potato chips, and high-fat dressings. ? High-fat meats, such as hot dogs and fatty cuts of red and white meats, such as rib eye steak, sausage, ham, and bacon. ? High-fat dairy items, such as whole milk, butter, and cream cheese.  Eat small, frequent meals instead of large meals.  Avoid drinking large amounts of liquid with your meals.  Avoid eating meals during the 2-3 hours before bedtime.  Avoid lying down right after you eat.  Do not exercise right after you eat. General instructions  Pay attention to any changes in your symptoms.  Take over-the-counter and prescription medicines only as told by your health care provider. Do not take aspirin, ibuprofen, or other  NSAIDs unless your health care provider told you to do so.  Do not use any tobacco products, including cigarettes, chewing tobacco, and e-cigarettes. If you need help quitting, ask your health care provider.  Wear loose-fitting clothing. Do not wear anything tight around your waist that causes pressure on your abdomen.  Raise (elevate) the head of your bed 6 inches (15cm).  Try to reduce your stress, such as with yoga or meditation. If you need help reducing stress, ask your health care provider.  If you are overweight, reduce your weight to an amount that is healthy for you. Ask your health care provider for guidance about a safe weight loss goal.  Keep all follow-up visits as told by your health care provider. This is important. Contact a health care provider if:  You have new symptoms.  You have unexplained weight loss.  You have difficulty  swallowing, or it hurts to swallow.  You have wheezing or a persistent cough.  Your symptoms do not improve with treatment.  You have a hoarse voice. Get help right away if:  You have pain in your arms, neck, jaw, teeth, or back.  You feel sweaty, dizzy, or light-headed.  You have chest pain or shortness of breath.  You vomit and your vomit looks like blood or coffee grounds.  You faint.  Your stool is bloody or black.  You cannot swallow, drink, or eat. This information is not intended to replace advice given to you by your health care provider. Make sure you discuss any questions you have with your health care provider. Document Released: 03/01/2005 Document Revised: 10/20/2015 Document Reviewed: 09/16/2014 Elsevier Interactive Patient Education  [employer].

## 2017-08-02 LAB — HIV ANTIBODY (ROUTINE TESTING W REFLEX): HIV 1&2 Ab, 4th Generation: NONREACTIVE

## 2017-08-02 MED ORDER — LEVOTHYROXINE SODIUM 75 MCG PO TABS: 75.0000 ug | ORAL_TABLET | Freq: Every day | ORAL | 0 refills | Status: DC

## 2017-08-02 NOTE — Addendum Note (Signed)
Addended by: Andrez GrimeKREMER, Lenix Kidd A on: 08/02/2017 08:45 AM   Modules accepted: Orders

## 2017-08-07 ENCOUNTER — Other Ambulatory Visit: Payer: Federal, State, Local not specified - PPO

## 2017-08-10 ENCOUNTER — Ambulatory Visit (INDEPENDENT_AMBULATORY_CARE_PROVIDER_SITE_OTHER)
Admission: RE | Admit: 2017-08-10 | Discharge: 2017-08-10 | Disposition: A | Discharge status: Home / Self Care | Payer: Federal, State, Local not specified - PPO | Source: Ambulatory Visit | Attending: Family Medicine | Admitting: Family Medicine

## 2017-08-10 DIAGNOSIS — R221 Localized swelling, mass and lump, neck: Secondary | ICD-10-CM | POA: Diagnosis not present

## 2017-08-10 MED ORDER — IOPAMIDOL (ISOVUE-300) INJECTION 61%
75.0000 mL | Freq: Once | INTRAVENOUS | Status: AC | PRN
  Administered 2017-08-10: 75 mL via INTRAVENOUS

## 2017-08-14 ENCOUNTER — Inpatient Hospital Stay: Admission: RE | Admit: 2017-08-14 | Payer: Self-pay | Source: Ambulatory Visit

## 2017-09-06 ENCOUNTER — Ambulatory Visit: Payer: Federal, State, Local not specified - PPO | Admitting: Psychology

## 2017-09-06 ENCOUNTER — Encounter: Payer: Self-pay | Admitting: Family Medicine

## 2017-09-06 ENCOUNTER — Ambulatory Visit: Payer: Federal, State, Local not specified - PPO | Admitting: Family Medicine

## 2017-09-06 VITALS — BP 140/82 | HR 77 | Ht 61.0 in | Wt 166.5 lb

## 2017-09-06 DIAGNOSIS — K219 Gastro-esophageal reflux disease without esophagitis: Secondary | ICD-10-CM | POA: Insufficient documentation

## 2017-09-06 DIAGNOSIS — E559 Vitamin D deficiency, unspecified: Secondary | ICD-10-CM | POA: Insufficient documentation

## 2017-09-06 DIAGNOSIS — F4323 Adjustment disorder with mixed anxiety and depressed mood: Secondary | ICD-10-CM

## 2017-09-06 DIAGNOSIS — M2550 Pain in unspecified joint: Secondary | ICD-10-CM | POA: Insufficient documentation

## 2017-09-06 DIAGNOSIS — F341 Dysthymic disorder: Secondary | ICD-10-CM | POA: Diagnosis not present

## 2017-09-06 DIAGNOSIS — E039 Hypothyroidism, unspecified: Secondary | ICD-10-CM | POA: Insufficient documentation

## 2017-09-06 HISTORY — DX: Vitamin D deficiency, unspecified: E55.9

## 2017-09-06 HISTORY — DX: Hypothyroidism, unspecified: E03.9

## 2017-09-06 HISTORY — DX: Gastro-esophageal reflux disease without esophagitis: K21.9

## 2017-09-06 MED ORDER — VITAMIN D (ERGOCALCIFEROL) 1.25 MG (50000 UNIT) PO CAPS: 50000.0000 [IU] | ORAL_CAPSULE | ORAL | 2 refills | Status: DC

## 2017-09-06 NOTE — Progress Notes (Signed)
Subjective:  Patient ID: Starlyn Skeans, female    DOB: 05/08/1966  Age: 52 y.o. MRN: [id number]  CC: Follow-up   HPI Merrilyn Vacca presents for follow-up of her chest pressure which has improved greatly.  Her mother and father are still living.  They are in relatively good health.  Mother's parents lived to be into their late 27s.  Talking therapy has been helpful but she is not sure that she is going to continue with it.  She does have a strong spiritual practice.  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  She has scattered joint aches and pains to include her hips and the left elbow.  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  She uses her left arm to put the mail in the mail boxes.  He is compliant with her thyroid medicine and does feel better on the higher dose.  This is all has helped a great deal with her reflux symptoms.  She believes it is also helped her chest pressure.  She has a past medical history of vitamin D deficiency and would like to restart high dose replacement.  Outpatient Medications Prior to Visit  Medication Sig Dispense Refill  . albuterol (PROVENTIL HFA;VENTOLIN HFA) 108 (90 Base) MCG/ACT inhaler Inhale 2 puffs into the lungs every 4 (four) hours as needed for wheezing or shortness of breath. 1 Inhaler 1  . fluticasone (FLONASE) 50 MCG/ACT nasal spray Place 2 sprays into both nostrils daily. 16 g 6  . levothyroxine (SYNTHROID, LEVOTHROID) 75 MCG tablet Take 1 tablet (75 mcg total) by mouth daily before breakfast. 90 tablet 0  . Multiple Vitamin (MULTIVITAMIN WITH MINERALS) TABS tablet Take 1 tablet by mouth daily.    . Omeprazole 20 MG TBDD Take 1 tablet by mouth daily. 30 tablet 2  . amoxicillin (AMOXIL) 500 MG capsule Take 1 capsule (500 mg total) by mouth 3 (three) times daily. 30 capsule 0  . predniSONE (DELTASONE) 50 MG tablet One  A day 5 tablet 0   No facility-administered  medications prior to visit.     ROS Review of Systems  Constitutional: Negative.   HENT: Negative.   Eyes: Negative.   Respiratory: Negative.  Negative for chest tightness and wheezing.   Cardiovascular: Negative for chest pain and palpitations.  Gastrointestinal: Negative.   Endocrine: Negative for cold intolerance and heat intolerance.  Musculoskeletal: Positive for arthralgias and myalgias.  Skin: Negative.   Allergic/Immunologic: Negative for immunocompromised state.  Neurological: Negative for weakness and headaches.  Hematological: Does not bruise/bleed easily.  Psychiatric/Behavioral: Negative.     Objective:  BP 140/82 (BP Location: Left Arm, Patient Position: Sitting, Cuff Size: Normal)   Pulse 77   Ht 5\' 1"  (1.549 m)   Wt 166 lb 8 oz (75.5 kg)   LMP  (LMP Unknown)   SpO2 98%   BMI 31.46 kg/m   BP Readings from Last 3 Encounters:  09/06/17 140/82  08/01/17 130/70  07/27/17 118/67    Wt Readings from Last 3 Encounters:  09/06/17 166 lb 8 oz (75.5 kg)  08/01/17 166 lb 3.2 oz (75.4 kg)  07/23/17 168 lb 4 oz (76.3 kg)    Physical Exam  Constitutional: She is oriented to person, place, and time. She appears well-developed and well-nourished. No distress.  HENT:  Head: Normocephalic and atraumatic.  Right Ear: External ear normal.  Left Ear:  External ear normal.  Mouth/Throat: Oropharynx is clear and moist. No oropharyngeal exudate.  Eyes: Pupils are equal, round, and reactive to light. Right eye exhibits no discharge. Left eye exhibits no discharge. No scleral icterus.  Neck: Neck supple. No JVD present. No tracheal deviation present. No thyromegaly present.  Cardiovascular: Normal rate, regular rhythm and normal heart sounds.  Pulmonary/Chest: Effort normal and breath sounds normal. No stridor.  Abdominal: Bowel sounds are normal. She exhibits no distension. There is no tenderness. There is no rebound and no guarding.  Lymphadenopathy:    She has no  cervical adenopathy.  Neurological: She is alert and oriented to person, place, and time.  Skin: Skin is warm and dry. She is not diaphoretic.  Psychiatric: She has a normal mood and affect. Her behavior is normal.    Lab Results  Component Value Date   WBC 7.9 08/01/2017   HGB 13.0 08/01/2017   HCT 38.7 08/01/2017   PLT 217.0 08/01/2017   GLUCOSE 80 08/01/2017   CHOL 169 08/01/2017   TRIG 147.0 08/01/2017   HDL 62.30 08/01/2017   LDLCALC 77 08/01/2017   ALT 20 08/01/2017   AST 16 08/01/2017   NA 137 08/01/2017   K 3.5 08/01/2017   CL 101 08/01/2017   CREATININE 0.70 08/01/2017   BUN 16 08/01/2017   CO2 29 08/01/2017   TSH 7.62 (H) 08/01/2017    Ct Soft Tissue Neck W Contrast  Result Date: 08/10/2017 CLINICAL DATA:  Pain in the anterior neck over the last 2 weeks. Assess for mass. EXAM: CT NECK WITH CONTRAST TECHNIQUE: Multidetector CT imaging of the neck was performed using the standard protocol following the bolus administration of intravenous contrast. CONTRAST:  75mL ISOVUE-300 IOPAMIDOL (ISOVUE-300) INJECTION 61% COMPARISON:  None. FINDINGS: Pharynx and larynx: No mucosal or submucosal lesions seen. Salivary glands: Parotid and submandibular glands are normal. Thyroid: Normal.  No thyroid mass. Lymph nodes: No enlarged or low-density nodes on either side of the neck. Vascular: Arterial and venous structures appear normal. 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 Upper chest: Normal Other: None IMPRESSION: No abnormality seen to explain anterior neck pain. No sign of neoplastic or inflammatory disease. Electronically Signed   By: Paulina Fusi M.D.   On: 08/10/2017 12:08    Assessment & Plan:   Sausha was seen today for follow-up.  Diagnoses and all orders for this visit:  Acquired hypothyroidism  Gastroesophageal reflux  disease, esophagitis presence not specified  Vitamin D deficiency  Arthralgia, unspecified joint  Dysthymia  Other orders -     Vitamin D, Ergocalciferol, (DRISDOL) 50000 units CAPS capsule; Take 1 capsule (50,000 Units total) by mouth every 7 (seven) days.   I have discontinued Myana Abascal's predniSONE and amoxicillin. I am also having her start on Vitamin D (Ergocalciferol). Additionally, I am having her maintain her fluticasone, multivitamin with minerals, albuterol, Omeprazole, and levothyroxine.  Meds ordered this encounter  Medications  . Vitamin D, Ergocalciferol, (DRISDOL) 50000 units CAPS capsule    Sig: Take 1 capsule (50,000 Units total) by mouth every 7 (seven) days.    Dispense:  5 capsule    Refill:  2   She will continue her Prilosec and levothyroxine.  Have added a high dose vitamin D.  Suggested that she might try to Tumeric capsules for her joint aches and pains she will follow-up in approximately a month to  have her TSH and vitamin D checked.   Follow-up: Return in about 1 month (around 10/06/2017).  Mliss SaxWilliam Alfred Kremer, MD

## 2017-09-10 ENCOUNTER — Encounter: Payer: Self-pay | Admitting: Family Medicine

## 2017-10-16 ENCOUNTER — Ambulatory Visit: Payer: Federal, State, Local not specified - PPO | Admitting: Family Medicine

## 2017-10-16 ENCOUNTER — Encounter: Payer: Self-pay | Admitting: Family Medicine

## 2017-10-16 VITALS — BP 128/78 | HR 74 | Ht 61.0 in | Wt 164.4 lb

## 2017-10-16 DIAGNOSIS — E559 Vitamin D deficiency, unspecified: Secondary | ICD-10-CM | POA: Diagnosis not present

## 2017-10-16 DIAGNOSIS — J301 Allergic rhinitis due to pollen: Secondary | ICD-10-CM | POA: Diagnosis not present

## 2017-10-16 DIAGNOSIS — E039 Hypothyroidism, unspecified: Secondary | ICD-10-CM

## 2017-10-16 NOTE — Progress Notes (Addendum)
Subjective:  Patient ID: Maria Myers, female    DOB: May 12, 1966  Age: 52 y.o. MRN: [id number]  CC: Follow-up   HPI Maria Myers presents for follow-up of her hypothyroidism and vitamin D deficiency.  She has been taking her levothyroxine daily on an fasting stomach as directed.  She has noted some increase in energy.  She is taking her vitamin D weekly as directed.  Allergy season is slowing down for her.  She is no longer taking her morning Benadryl or her evening Flonase.  While her prodigal daughter is still estranged, she did send word through a sibling to which her mother a happy Mother's Day.  Patient continues to exercise and was somewhat concerned because she did not feel her heart rate increase with exercise.  Outpatient Medications Prior to Visit  Medication Sig Dispense Refill  . albuterol (PROVENTIL HFA;VENTOLIN HFA) 108 (90 Base) MCG/ACT inhaler Inhale 2 puffs into the lungs every 4 (four) hours as needed for wheezing or shortness of breath. 1 Inhaler 1  . fluticasone (FLONASE) 50 MCG/ACT nasal spray Place 2 sprays into both nostrils daily. 16 g 6  . Multiple Vitamin (MULTIVITAMIN WITH MINERALS) TABS tablet Take 1 tablet by mouth daily.    . Omeprazole 20 MG TBDD Take 1 tablet by mouth daily. 30 tablet 2  . Vitamin D, Ergocalciferol, (DRISDOL) 50000 units CAPS capsule Take 1 capsule (50,000 Units total) by mouth every 7 (seven) days. 5 capsule 2  . levothyroxine (SYNTHROID, LEVOTHROID) 75 MCG tablet Take 1 tablet (75 mcg total) by mouth daily before breakfast. 90 tablet 0   No facility-administered medications prior to visit.     ROS Review of Systems  Constitutional: Negative for chills, fatigue, fever and unexpected weight change.  HENT: Negative for postnasal drip, rhinorrhea, sinus pressure, sinus pain and sneezing.   Eyes: Negative for itching and visual disturbance.  Respiratory: Negative.  Negative for chest tightness and shortness of breath.   Cardiovascular: Negative  for chest pain and palpitations.  Gastrointestinal: Negative.   Allergic/Immunologic: Negative for immunocompromised state.  Neurological: Negative for headaches.  Hematological: Does not bruise/bleed easily.  Psychiatric/Behavioral: Negative for dysphoric mood. The patient is not nervous/anxious.     Objective:  BP 128/78   Pulse 74   Ht  (1.549 m)   Wt 164 lb 6 oz (74.6 kg)   LMP  (LMP Unknown)   SpO2 98%   BMI 31.06 kg/m   BP Readings from Last 3 Encounters:  10/16/17 128/78  09/06/17 140/82  08/01/17 130/70    Wt Readings from Last 3 Encounters:  10/16/17 164 lb 6 oz (74.6 kg)  09/06/17 166 lb 8 oz (75.5 kg)  08/01/17 166 lb 3.2 oz (75.4 kg)    Physical Exam  Constitutional: She is oriented to person, place, and time. She appears well-developed and well-nourished.  HENT:  Head: Normocephalic.  Right Ear: External ear normal.  Left Ear: External ear normal.  Mouth/Throat: Oropharynx is clear and moist. No oropharyngeal exudate.  Eyes: Pupils are equal, round, and reactive to light. Conjunctivae and EOM are normal. Right eye exhibits no discharge. Left eye exhibits no discharge. No scleral icterus.  Neck: Neck supple. No JVD present. No tracheal deviation present. No thyromegaly present.  Cardiovascular: Normal rate, regular rhythm and normal heart sounds.  Pulmonary/Chest: Effort normal.  Neurological: She is alert and oriented to person, place, and time.  Psychiatric: She has a normal mood and affect. Her behavior is normal. Thought content normal.  Lab Results  Component Value Date   WBC 7.9 08/01/2017   HGB 13.0 08/01/2017   HCT 38.7 08/01/2017   PLT 217.0 08/01/2017   GLUCOSE 80 08/01/2017   CHOL 169 08/01/2017   TRIG 147.0 08/01/2017   HDL 62.30 08/01/2017   LDLCALC 77 08/01/2017   ALT 20 08/01/2017   AST 16 08/01/2017   NA 137 08/01/2017   K 3.5 08/01/2017   CL 101 08/01/2017   CREATININE 0.70 08/01/2017   BUN 16 08/01/2017   CO2 29  08/01/2017   TSH 1.04 10/16/2017    Ct Soft Tissue Neck W Contrast  Result Date: 08/10/2017 CLINICAL DATA:  Pain in the anterior neck over the last 2 weeks. Assess for mass. EXAM: CT NECK WITH CONTRAST TECHNIQUE: Multidetector CT imaging of the neck was performed using the standard protocol following the bolus administration of intravenous contrast. CONTRAST:  75mL ISOVUE-300 IOPAMIDOL (ISOVUE-300) INJECTION 61% COMPARISON:  None. FINDINGS: Pharynx and larynx: No mucosal or submucosal lesions seen. Salivary glands: Parotid and submandibular glands are normal. Thyroid: Normal.  No thyroid mass. Lymph nodes: No enlarged or low-density nodes on either side of the neck. Vascular: Arterial and venous structures appear normal. 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 Upper chest: Normal Other: None IMPRESSION: No abnormality seen to explain anterior neck pain. No sign of neoplastic or inflammatory disease. Electronically Signed   By: Paulina Fusi M.D.   On: 08/10/2017 12:08    Assessment & Plan:   Maria Myers was seen today for follow-up.  Diagnoses and all orders for this visit:  Seasonal allergic rhinitis due to pollen  Vitamin D deficiency -     VITAMIN D 25 Hydroxy (Vit-D Deficiency, Fractures) -     calcium-vitamin D (OSCAL WITH D) 500-200 MG-UNIT tablet; Take 1 tablet by mouth 2 (two) times daily.  Acquired hypothyroidism -     TSH -     levothyroxine (SYNTHROID, LEVOTHROID) 75 MCG tablet; Take 1 tablet (75 mcg total) by mouth daily before breakfast.   I am having Maria Myers start on calcium-vitamin D. I am also having her maintain her fluticasone, multivitamin with minerals, albuterol, Omeprazole, Vitamin D (Ergocalciferol), and levothyroxine.  Meds ordered this encounter  Medications  . levothyroxine (SYNTHROID, LEVOTHROID) 75 MCG tablet      Sig: Take 1 tablet (75 mcg total) by mouth daily before breakfast.    Dispense:  90 tablet    Refill:  2  . calcium-vitamin D (OSCAL WITH D) 500-200 MG-UNIT tablet    Sig: Take 1 tablet by mouth 2 (two) times daily.    Dispense:  180 tablet    Refill:  5   Reassured patient and encouraged her to continue her exercise and taking her medicines as directed.  Follow-up: Return suggested follow up will pend results of todays blood work. Mliss Sax, MD

## 2017-10-17 LAB — VITAMIN D 25 HYDROXY (VIT D DEFICIENCY, FRACTURES): VITD: 36.27 ng/mL (ref 30.00–100.00)

## 2017-10-17 LAB — TSH: TSH: 1.04 u[IU]/mL (ref 0.35–4.50)

## 2017-10-17 MED ORDER — CALCIUM CARBONATE-VITAMIN D 500-200 MG-UNIT PO TABS
1.0000 | ORAL_TABLET | Freq: Two times a day (BID) | ORAL | 5 refills | Status: DC
Start: 2017-10-17 — End: 2018-10-31

## 2017-10-17 MED ORDER — LEVOTHYROXINE SODIUM 75 MCG PO TABS: 75.0000 ug | ORAL_TABLET | Freq: Every day | ORAL | 2 refills | Status: DC

## 2017-10-17 NOTE — Addendum Note (Signed)
Addended by: Nadene Rubins A on: 10/17/2017 11:55 AM   Modules accepted: Orders

## 2017-10-30 ENCOUNTER — Telehealth: Payer: Self-pay | Admitting: Family Medicine

## 2017-10-30 DIAGNOSIS — Z91018 Allergy to other foods: Secondary | ICD-10-CM

## 2017-10-30 NOTE — Telephone Encounter (Signed)
Allergist referral okay. Don't understand why she needs to see the cardiologist.

## 2017-10-30 NOTE — Telephone Encounter (Signed)
Copied from CRM [id number]. Topic: Referral - Request >> Oct 30, 2017 11:19 AM Maria Myers, Rosey Bath D wrote: Reason for CRM: Patient called and would like to talk to Dr. Doreene Burke or his CMA about having a referral sent for her to see a cardiologist and to see someone for a food allergy test. Please call patient back, thanks.

## 2017-10-30 NOTE — Telephone Encounter (Signed)
I called and spoke with patient. Patient is still experiencing heart issues and chest pressure. She had forgotten to mention it during her last appointment. Appointment made for patient for 8:30am on 5/29 with Dr. Doreene Burke to see if cardiology referral is needed. Referral to allergist for food allergies has been entered.

## 2017-10-30 NOTE — Telephone Encounter (Signed)
Okay for cardiology & allergy referrals?

## 2017-10-31 ENCOUNTER — Other Ambulatory Visit: Payer: Self-pay | Admitting: Family Medicine

## 2017-10-31 ENCOUNTER — Encounter: Payer: Self-pay | Admitting: Family Medicine

## 2017-10-31 ENCOUNTER — Ambulatory Visit: Payer: Federal, State, Local not specified - PPO | Admitting: Family Medicine

## 2017-10-31 VITALS — BP 124/80 | HR 75 | Ht 61.0 in | Wt 165.4 lb

## 2017-10-31 DIAGNOSIS — F419 Anxiety disorder, unspecified: Secondary | ICD-10-CM

## 2017-10-31 DIAGNOSIS — R0789 Other chest pain: Secondary | ICD-10-CM

## 2017-10-31 HISTORY — DX: Anxiety disorder, unspecified: F41.9

## 2017-10-31 MED ORDER — PAROXETINE HCL ER 12.5 MG PO TB24: 12.5000 mg | ORAL_TABLET | Freq: Every day | ORAL | 1 refills | Status: DC

## 2017-10-31 NOTE — Progress Notes (Signed)
Subjective:  Patient ID: Maria Myers, female    DOB: 19-Sep-1965  Age: 52 y.o. 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  Pressure is in her upper chest and has involved numbness in her right shoulder.  With this she denies shortness of breath, abdominal pain, nausea vomiting, diaphoresis, and neck pain.  No known family history of gallbladder disease.  She does not smoke or drink alcohol.  Favorable recent lipid profile with an LDL of 77 and a HDL of 62. EKGs, multiple have been normal.   Outpatient Medications Prior to Visit  Medication Sig Dispense Refill  . albuterol (PROVENTIL HFA;VENTOLIN HFA) 108 (90 Base) MCG/ACT inhaler Inhale 2 puffs into the lungs every 4 (four) hours as needed for wheezing or shortness of breath. 1 Inhaler 1  . calcium-vitamin D (OSCAL WITH D) 500-200 MG-UNIT tablet Take 1 tablet by mouth 2 (two) times daily. 180 tablet 5  . fluticasone (FLONASE) 50 MCG/ACT nasal spray Place 2 sprays into both nostrils daily. 16 g 6  . levothyroxine (SYNTHROID, LEVOTHROID) 75 MCG tablet Take 1 tablet (75 mcg total) by mouth daily before breakfast. 90 tablet 2  . Multiple Vitamin (MULTIVITAMIN WITH MINERALS) TABS tablet Take 1 tablet by mouth daily.    . Omeprazole 20 MG TBDD Take 1 tablet by mouth daily. 30 tablet 2  . Vitamin D, Ergocalciferol, (DRISDOL) 50000 units CAPS capsule Take 1 capsule (50,000 Units total) by mouth every 7 (seven) days. 5 capsule 2   No facility-administered medications prior to visit.     ROS Review of Systems  Constitutional: Negative for chills, diaphoresis, fatigue and fever.  HENT: Positive for postnasal drip, rhinorrhea and sneezing.   Respiratory: Positive for chest tightness. Negative for cough, shortness of breath and wheezing.   Cardiovascular: Positive for chest pain. Negative for palpitations and leg swelling.    Gastrointestinal: Negative for nausea and vomiting.  Endocrine: Negative for cold intolerance and heat intolerance.  Genitourinary: Negative.   Musculoskeletal: Negative for arthralgias and myalgias.  Skin: Negative.  Negative for pallor and rash.  Allergic/Immunologic: Negative for immunocompromised state.  Neurological: Negative for headaches.  Hematological: Does not bruise/bleed easily.  Psychiatric/Behavioral: The patient is nervous/anxious.     Objective:  BP 124/80   Pulse 75   Ht  (1.549 m)   Wt 165 lb 6 oz (75 kg)   LMP  (LMP Unknown)   SpO2 98%   BMI 31.25 kg/m   BP Readings from Last 3 Encounters:  10/31/17 124/80  10/16/17 128/78  09/06/17 140/82    Wt Readings from Last 3 Encounters:  10/31/17 165 lb 6 oz (75 kg)  10/16/17 164 lb 6 oz (74.6 kg)  09/06/17 166 lb 8 oz (75.5 kg)    Physical Exam  Constitutional: She is oriented to person, place, and time. She appears well-developed and well-nourished. No distress.  HENT:  Head: Normocephalic and atraumatic.  Right Ear: External ear normal.  Left Ear: External ear normal.  Mouth/Throat: Oropharynx is clear and moist. No oropharyngeal exudate.  Eyes: Pupils are equal, round, and reactive to light. Conjunctivae and EOM are normal. Right eye exhibits no discharge. Left eye exhibits no discharge. No scleral icterus.  Neck: No JVD present. No tracheal deviation present. No thyromegaly present.  Cardiovascular: Normal rate, regular rhythm and normal heart sounds.  Pulmonary/Chest: Effort normal and breath  sounds normal.  Abdominal: Soft. Bowel sounds are normal. She exhibits no distension and no mass. There is no tenderness. There is no rebound and no guarding.  Lymphadenopathy:    She has no cervical adenopathy.  Neurological: She is alert and oriented to person, place, and time.  Skin: Skin is warm and dry. No rash noted. She is not diaphoretic. No erythema. No pallor.  Psychiatric: She has a normal mood  and affect. Her behavior is normal.   Depression screen [facility] 2/9 10/31/2017 08/01/2017  Decreased Interest 0 -  Down, Depressed, Hopeless 0 3  PHQ - 2 Score 0 3  Altered sleeping 0 0  Tired, decreased energy 0 2  Change in appetite 2 3  Feeling bad or failure about yourself  1 1  Trouble concentrating 1 0  Moving slowly or fidgety/restless 0 1  Suicidal thoughts 0 0  PHQ-9 Score 4 10    Lab Results  Component Value Date   WBC 7.9 08/01/2017   HGB 13.0 08/01/2017   HCT 38.7 08/01/2017   PLT 217.0 08/01/2017   GLUCOSE 80 08/01/2017   CHOL 169 08/01/2017   TRIG 147.0 08/01/2017   HDL 62.30 08/01/2017   LDLCALC 77 08/01/2017   ALT 20 08/01/2017   AST 16 08/01/2017   NA 137 08/01/2017   K 3.5 08/01/2017   CL 101 08/01/2017   CREATININE 0.70 08/01/2017   BUN 16 08/01/2017   CO2 29 08/01/2017   TSH 1.04 10/16/2017    Ct Soft Tissue Neck W Contrast  Result Date: 08/10/2017 CLINICAL DATA:  Pain in the anterior neck over the last 2 weeks. Assess for mass. EXAM: CT NECK WITH CONTRAST TECHNIQUE: Multidetector CT imaging of the neck was performed using the standard protocol following the bolus administration of intravenous contrast. CONTRAST:  75mL ISOVUE-300 IOPAMIDOL (ISOVUE-300) INJECTION 61% COMPARISON:  None. FINDINGS: Pharynx and larynx: No mucosal or submucosal lesions seen. Salivary glands: Parotid and submandibular glands are normal. Thyroid: Normal.  No thyroid mass. Lymph nodes: No enlarged or low-density nodes on either side of the neck. Vascular: Arterial and venous structures appear normal. 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 Upper chest: Normal Other: None IMPRESSION: No abnormality seen to explain anterior neck pain. No sign of neoplastic or inflammatory disease. Electronically Signed   By: Paulina Fusi M.D.   On:  08/10/2017 12:08    Assessment & Plan:   Canesha was seen today for heart problem.  Diagnoses and all orders for this visit:  Chest pressure -     US Abdomen Limited RUQ; Future -     PARoxetine (PAXIL-CR) 12.5 MG 24 hr tablet; Take 1 tablet (12.5 mg total) by mouth daily.  Anxiety -     PARoxetine (PAXIL-CR) 12.5 MG 24 hr tablet; Take 1 tablet (12.5 mg total) by mouth daily.   I am having Lynanne Bonny start on PARoxetine. I am also having her maintain her fluticasone, multivitamin with minerals, albuterol, Omeprazole, Vitamin D (Ergocalciferol), levothyroxine, and calcium-vitamin D.  Meds ordered this encounter  Medications  . PARoxetine (PAXIL-CR) 12.5 MG 24 hr tablet    Sig: Take 1 tablet (12.5 mg total) by mouth daily.    Dispense:  30 tablet    Refill:  1   Believe the patient's symptoms are attributable anxiety.  Trial of lower dose controlled release Paxil.  Discussed this medicine with the  patient.  Anticipatory guidance was given to the patient.  Follow-up in 3 to 4 weeks.  Right upper quadrant ultrasound to rule out cholelithiasis.  Follow-up: Return in about 1 month (around 11/28/2017).  Mliss Sax, MD

## 2017-10-31 NOTE — Patient Instructions (Signed)
Living With Anxiety After being diagnosed with an anxiety disorder, you may be relieved to know why you have felt or behaved a certain way. It is natural to also feel overwhelmed about the treatment ahead and what it will mean for your life. With care and support, you can manage this condition and recover from it. How to cope with anxiety Dealing with stress Stress is your body's reaction to life changes and events, both good and bad. Stress can last just a few hours or it can be ongoing. Stress can play a major role in anxiety, so it is important to learn both how to cope with stress and how to think about it differently. Talk with your health care provider or a counselor to learn more about stress reduction. He or she may suggest some stress reduction techniques, such as:  Music therapy. This can include creating or listening to music that you enjoy and that inspires you.  Mindfulness-based meditation. This involves being aware of your normal breaths, rather than trying to control your breathing. It can be done while sitting or walking.  Centering prayer. This is a kind of meditation that involves focusing on a word, phrase, or sacred image that is meaningful to you and that brings you peace.  Deep breathing. To do this, expand your stomach and inhale slowly through your nose. Hold your breath for 3-5 seconds. Then exhale slowly, allowing your stomach muscles to relax.  Self-talk. This is a skill where you identify thought patterns that lead to anxiety reactions and correct those thoughts.  Muscle relaxation. This involves tensing muscles then relaxing them.  Choose a stress reduction technique that fits your lifestyle and personality. Stress reduction techniques take time and practice. Set aside 5-15 minutes a day to do them. Therapists can offer training in these techniques. The training may be covered by some insurance plans. Other things you can do to manage stress include:  Keeping a  stress diary. This can help you learn what triggers your stress and ways to control your response.  Thinking about how you respond to certain situations. You may not be able to control everything, but you can control your reaction.  Making time for activities that help you relax, and not feeling guilty about spending your time in this way.  Therapy combined with coping and stress-reduction skills provides the best chance for successful treatment. Medicines Medicines can help ease symptoms. Medicines for anxiety include:  Anti-anxiety drugs.  Antidepressants.  Beta-blockers.  Medicines may be used as the main treatment for anxiety disorder, along with therapy, or if other treatments are not working. Medicines should be prescribed by a health care provider. Relationships Relationships can play a big part in helping you recover. Try to spend more time connecting with trusted friends and family members. Consider going to couples counseling, taking family education classes, or going to family therapy. Therapy can help you and others better understand the condition. How to recognize changes in your condition Everyone has a different response to treatment for anxiety. Recovery from anxiety happens when symptoms decrease and stop interfering with your daily activities at home or work. This may mean that you will start to:  Have better concentration and focus.  Sleep better.  Be less irritable.  Have more energy.  Have improved memory.  It is important to recognize when your condition is getting worse. Contact your health care provider if your symptoms interfere with home or work and you do not feel like your condition   is improving. Where to find help and support: You can get help and support from these sources:  Self-help groups.  Online and [employer].  A trusted spiritual leader.  Couples counseling.  Family education classes.  Family therapy.  Follow these  instructions at home:  Eat a healthy diet that includes plenty of vegetables, fruits, whole grains, low-fat dairy products, and lean protein. Do not eat a lot of foods that are high in solid fats, added sugars, or salt.  Exercise. Most adults should do the following: ? Exercise for at least 150 minutes each week. The exercise should increase your heart rate and make you sweat (moderate-intensity exercise). ? Strengthening exercises at least twice a week.  Cut down on caffeine, tobacco, alcohol, and other potentially harmful substances.  Get the right amount and quality of sleep. Most adults need 7-9 hours of sleep each night.  Make choices that simplify your life.  Take over-the-counter and prescription medicines only as told by your health care provider.  Avoid caffeine, alcohol, and certain over-the-counter cold medicines. These may make you feel worse. Ask your pharmacist which medicines to avoid.  Keep all follow-up visits as told by your health care provider. This is important. Questions to ask your health care provider  Would I benefit from therapy?  How often should I follow up with a health care provider?  How long do I need to take medicine?  Are there any long-term side effects of my medicine?  Are there any alternatives to taking medicine? Contact a health care provider if:  You have a hard time staying focused or finishing daily tasks.  You spend many hours a day feeling worried about everyday life.  You become exhausted by worry.  You start to have headaches, feel tense, or have nausea.  You urinate more than normal.  You have diarrhea. Get help right away if:  You have a racing heart and shortness of breath.  You have thoughts of hurting yourself or others. If you ever feel like you may hurt yourself or others, or have thoughts about taking your own life, get help right away. You can go to your nearest emergency department or call:  Your local emergency  services (911 in the [place]  A suicide crisis helpline, such as the National Suicide Prevention Lifeline at (260) 164-3908. This is open 24-hours a day.  Summary  Taking steps to deal with stress can help calm you.  Medicines cannot cure anxiety disorders, but they can help ease symptoms.  Family, friends, and partners can play a big part in helping you recover from an anxiety disorder. This information is not intended to replace advice given to you by your health care provider. Make sure you discuss any questions you have with your health care provider. Document Released: 05/16/2016 Document Revised: 05/16/2016 Document Reviewed: 05/16/2016 Elsevier Interactive Patient Education  2018 [employer]. Paroxetine Controlled-Release Tablets What is this medicine? PAROXETINE (pa ROX e teen) is used to treat depression. It may also be used to treat anxiety disorders, obsessive compulsive disorder, panic attacks, post traumatic stress, and premenstrual dysphoric disorder (PMDD). This medicine may be used for other purposes; ask your health care provider or pharmacist if you have questions. COMMON BRAND NAME(S): Paxil CR What should I tell my health care provider before I take this medicine? They need to know if you have any of these conditions: -bipolar disorder or a family history of bipolar disorder -bleeding disorders -glaucoma -heart disease -kidney disease -liver disease -low  levels of sodium in the blood -seizures -suicidal thoughts, plans, or attempt; a previous suicide attempt by you or a family member -take MAOIs like Carbex, Eldepryl, Marplan, Nardil, and Parnate -take medicines that treat or prevent blood clots -thyroid disease -an unusual or allergic reaction to paroxetine, other medicines, foods, dyes, or preservatives -pregnant or trying to get pregnant -breast-feeding How should I use this medicine? Take this medicine by mouth with a glass of water. Follow the directions  on the prescription label. You can take it with or without food. Do not crush or chew this medicine. Take your medicine at regular intervals. Do not take your medicine more often than directed. Do not stop taking this medicine suddenly except upon the advice of your doctor. Stopping this medicine too quickly may cause serious side effects or your condition may worsen. A special MedGuide will be given to you by the pharmacist with each prescription and refill. Be sure to read this information carefully each time. Talk to your pediatrician regarding the use of this medicine in children. Special care may be needed. Overdosage: If you think you have taken too much of this medicine contact a poison control center or emergency room at once. NOTE: This medicine is only for you. Do not share this medicine with others. What if I miss a dose? If you miss a dose, take it as soon as you can. If it is almost time for your next dose, take only that dose. Do not take double or extra doses. What may interact with this medicine? Do not take this medicine with any of the following medications: -linezolid -MAOIs like Carbex, Eldepryl, Marplan, Nardil, and Parnate -methylene blue (injected into a vein) -pimozide -thioridazine This medicine may also interact with the following medications: -alcohol -amphetamines -aspirin and aspirin-like medicines -atomoxetine -certain medicines for depression, anxiety, or psychotic disturbances -certain medicines for irregular heart beat like propafenone, flecainide, encainide, and quinidine -certain medicines for migraine headache like almotriptan, eletriptan, frovatriptan, naratriptan, rizatriptan, sumatriptan, zolmitriptan -cimetidine -digoxin -diuretics -fentanyl -fosamprenavir -furazolidone -isoniazid -lithium -medicines that treat or prevent blood clots like warfarin, enoxaparin, and dalteparin -medicines for sleep -NSAIDs, medicines for pain and inflammation, like  ibuprofen or naproxen -phenobarbital -phenytoin -procarbazine -rasagiline -ritonavir -supplements like St. John's wort, kava kava, valerian -tamoxifen -tramadol -tryptophan This list may not describe all possible interactions. Give your health care provider a list of all the medicines, herbs, non-prescription drugs, or dietary supplements you use. Also tell them if you smoke, drink alcohol, or use illegal drugs. Some items may interact with your medicine. What should I watch for while using this medicine? Tell your doctor if your symptoms do not get better or if they get worse. Visit your doctor or health care professional for regular checks on your progress. Because it may take several weeks to see the full effects of this medicine, it is important to continue your treatment as prescribed by your doctor. Patients and their families should watch out for new or worsening thoughts of suicide or depression. Also watch out for sudden changes in feelings such as feeling anxious, agitated, panicky, irritable, hostile, aggressive, impulsive, severely restless, overly excited and hyperactive, or not being able to sleep. If this happens, especially at the beginning of treatment or after a change in dose, call your health care professional. Bonita Quin may get drowsy or dizzy. Do not drive, use machinery, or do anything that needs mental alertness until you know how this medicine affects you. Do not stand or sit up  quickly, especially if you are an older patient. This reduces the risk of dizzy or fainting spells. Alcohol may interfere with the effect of this medicine. Avoid alcoholic drinks. Your mouth may get dry. Chewing sugarless gum or sucking hard candy, and drinking plenty of water will help. Contact your doctor if the problem does not go away or is severe. What side effects may I notice from receiving this medicine? Side effects that you should report to your doctor or health care professional as soon as  possible: -allergic reactions like skin rash, itching or hives, swelling of the face, lips, or tongue -anxious -black, tarry stools -changes in vision -confusion -elevated mood, decreased need for sleep, racing thoughts, impulsive behavior -eye pain -fast, irregular heartbeat -feeling faint or lightheaded, falls -feeling agitated, angry, or irritable -hallucination, loss of contact with reality -loss of balance or coordination -loss of memory -painful or prolonged erections -restlessness, pacing, inability to keep still -seizures -stiff muscles -suicidal thoughts or other mood changes -trouble sleeping -unusual bleeding or bruising -unusually weak or tired -vomiting Side effects that usually do not require medical attention (report to your doctor or health care professional if they continue or are bothersome): -change in appetite or weight -change in sex drive or performance -diarrhea -dizziness -dry mouth -headache -increased sweating -indigestion, nausea -tired -tremors This list may not describe all possible side effects. Call your doctor for medical advice about side effects. You may report side effects to FDA at 1-800-FDA-1088. Where should I keep my medicine? Keep out of the reach of children. Store at or below 25 degrees C (77 degrees F). Throw away any unused medicine after the expiration date. NOTE: This sheet is a summary. It may not cover all possible information. If you have questions about this medicine, talk to your doctor, pharmacist, or health care provider.  2018 Elsevier/Gold Standard (2015-10-23 15:48:13)

## 2017-11-06 ENCOUNTER — Ambulatory Visit
Admission: RE | Admit: 2017-11-06 | Discharge: 2017-11-06 | Disposition: A | Discharge status: Home / Self Care | Payer: Federal, State, Local not specified - PPO | Source: Ambulatory Visit | Attending: Family Medicine | Admitting: Family Medicine

## 2017-11-06 DIAGNOSIS — R0789 Other chest pain: Secondary | ICD-10-CM

## 2017-11-28 ENCOUNTER — Other Ambulatory Visit (HOSPITAL_COMMUNITY): Payer: Self-pay | Admitting: Family Medicine

## 2017-11-28 DIAGNOSIS — R1310 Dysphagia, unspecified: Secondary | ICD-10-CM

## 2017-11-30 ENCOUNTER — Ambulatory Visit (HOSPITAL_COMMUNITY): Payer: Federal, State, Local not specified - PPO

## 2017-11-30 ENCOUNTER — Encounter (HOSPITAL_COMMUNITY): Payer: Self-pay

## 2017-12-05 ENCOUNTER — Ambulatory Visit (HOSPITAL_COMMUNITY)
Admission: RE | Admit: 2017-12-05 | Discharge: 2017-12-05 | Disposition: A | Discharge status: Home / Self Care | Payer: Federal, State, Local not specified - PPO | Source: Ambulatory Visit | Attending: Family Medicine | Admitting: Family Medicine

## 2017-12-05 DIAGNOSIS — R1310 Dysphagia, unspecified: Secondary | ICD-10-CM | POA: Insufficient documentation

## 2017-12-11 ENCOUNTER — Ambulatory Visit: Payer: Self-pay | Admitting: Family Medicine

## 2017-12-14 ENCOUNTER — Ambulatory Visit: Payer: Self-pay | Admitting: Allergy

## 2017-12-20 DIAGNOSIS — R0602 Shortness of breath: Secondary | ICD-10-CM

## 2018-01-03 ENCOUNTER — Ambulatory Visit: Payer: Self-pay | Admitting: Cardiology

## 2018-01-04 ENCOUNTER — Other Ambulatory Visit: Payer: Self-pay

## 2018-01-04 ENCOUNTER — Encounter: Payer: Self-pay | Admitting: *Deleted

## 2018-01-04 ENCOUNTER — Other Ambulatory Visit: Payer: Self-pay | Admitting: Cardiology

## 2018-01-04 ENCOUNTER — Ambulatory Visit: Payer: Federal, State, Local not specified - PPO | Admitting: Cardiology

## 2018-01-04 ENCOUNTER — Encounter: Payer: Self-pay | Admitting: Cardiology

## 2018-01-04 VITALS — BP 122/80 | HR 48 | Ht 62.0 in | Wt 159.0 lb

## 2018-01-04 DIAGNOSIS — F419 Anxiety disorder, unspecified: Secondary | ICD-10-CM

## 2018-01-04 DIAGNOSIS — I209 Angina pectoris, unspecified: Secondary | ICD-10-CM

## 2018-01-04 DIAGNOSIS — R9439 Abnormal result of other cardiovascular function study: Secondary | ICD-10-CM

## 2018-01-04 HISTORY — DX: Angina pectoris, unspecified: I20.9

## 2018-01-04 HISTORY — DX: Abnormal result of other cardiovascular function study: R94.39

## 2018-01-04 LAB — BASIC METABOLIC PANEL
BUN/Creatinine Ratio: 17 (ref 9–23)
BUN: 11 mg/dL (ref 6–24)
CALCIUM: 9.4 mg/dL (ref 8.7–10.2)
CHLORIDE: 102 mmol/L (ref 96–106)
CO2: 25 mmol/L (ref 20–29)
Creatinine, Ser: 0.66 mg/dL (ref 0.57–1.00)
GFR calc non Af Amer: 102 mL/min/{1.73_m2} (ref >59)
GFR, EST AFRICAN AMERICAN: 117 mL/min/{1.73_m2} (ref >59)
GLUCOSE: 86 mg/dL (ref 65–99)
Potassium: 4.5 mmol/L (ref 3.5–5.2)
Sodium: 142 mmol/L (ref 134–144)

## 2018-01-04 MED ORDER — METOPROLOL TARTRATE 25 MG PO TABS: 12.5000 mg | ORAL_TABLET | Freq: Two times a day (BID) | ORAL | 3 refills | Status: DC

## 2018-01-04 MED ORDER — ASPIRIN EC 81 MG PO TBEC: 81.0000 mg | DELAYED_RELEASE_TABLET | Freq: Every day | ORAL | 3 refills | Status: AC

## 2018-01-04 MED ORDER — ATORVASTATIN CALCIUM 40 MG PO TABS: 40.0000 mg | ORAL_TABLET | Freq: Every day | ORAL | 3 refills | Status: DC

## 2018-01-04 MED ORDER — RANOLAZINE ER 500 MG PO TB12: 500.0000 mg | ORAL_TABLET | Freq: Two times a day (BID) | ORAL | 3 refills | Status: DC

## 2018-01-04 MED ORDER — METOPROLOL TARTRATE 25 MG PO TABS: 12.5000 mg | ORAL_TABLET | Freq: Two times a day (BID) | ORAL | 2 refills | Status: DC

## 2018-01-04 NOTE — Telephone Encounter (Signed)
Please refill.

## 2018-01-04 NOTE — Progress Notes (Signed)
Cardiology Consultation:    Date:  01/04/2018   ID:  Maria Myers, DOB 11/29/1965, MRN [id number]  PCP:  Mliss Sax, MD  Cardiologist:  Gypsy Balsam, MD   Referring MD: Mliss Sax,*   Chief Complaint  Patient presents with  . New Patient (Initial Visit)  Lateral chest pain  History of Present Illness:    Maria Myers is a 52 y.o. female who is being seen today for the evaluation of chest pain at the request of Mliss Sax,*.  She is a young woman with no significant past medical history for last few weeks she experienced exertional tightness in the chest.  She goes to gym on the regular basis when she goes on elliptical treadmill.  To slow down and stop because of tightness in the chest she eventually ended up having stress test which showed ischemia in LAD territory.  She did very poorly on the treadmill but only more than 3 minutes.  She was given nitroglycerin as needed and she was referred to [place].  Again she does not have any past medical history there is no hypertension no diabetes no family history of premature coronary artery disease I do not know her cholesterol status is.  Overall it is a surprising finding that she gets fairly typical exertional angina pectoris with LAD ischemia on stress test.  She comes here to talk about options.  She never had resting pain.  Pain is stable there is no acceleration of frequency duration or severity of pain.  Past Medical History:  Diagnosis Date  . Allergy   . Thyroid disease     Past Surgical History:  Procedure Laterality Date  . CESAREAN SECTION      Current Medications: Current Meds  Medication Sig  . calcium-vitamin D (OSCAL WITH D) 500-200 MG-UNIT tablet Take 1 tablet by mouth 2 (two) times daily.  Marland Kitchen levothyroxine (SYNTHROID, LEVOTHROID) 75 MCG tablet Take 1 tablet (75 mcg total) by mouth daily before breakfast.  . Multiple Vitamin (MULTIVITAMIN WITH MINERALS) TABS tablet Take 1 tablet by mouth  daily.  . Omeprazole 20 MG TBDD Take 1 tablet by mouth daily.  Marland Kitchen PARoxetine (PAXIL-CR) 12.5 MG 24 hr tablet TAKE 1 TABLET(12.5 MG) BY MOUTH DAILY  . Vitamin D, Ergocalciferol, (DRISDOL) 50000 units CAPS capsule Take 1 capsule (50,000 Units total) by mouth every 7 (seven) days.     Allergies:   Patient has no known allergies.   Social History   Socioeconomic History  . Marital status: Married    Spouse name: Not on file  . Number of children: Not on file  . Years of education: Not on file  . Highest education level: Not on file  Occupational History  . Not on file  Social Needs  . Financial resource strain: Not on file  . Food insecurity:    Worry: Not on file    Inability: Not on file  . Transportation needs:    Medical: Not on file    Non-medical: Not on file  Tobacco Use  . Smoking status: Never Smoker  . Smokeless tobacco: Never Used  Substance and Sexual Activity  . Alcohol use: Not on file  . Drug use: No  . Sexual activity: Not on file  Lifestyle  . Physical activity:    Days per week: Not on file    Minutes per session: Not on file  . Stress: Not on file  Relationships  . Social connections:    Talks on  phone: Not on file    Gets together: Not on file    Attends religious service: Not on file    Active member of club or organization: Not on file    Attends meetings of clubs or organizations: Not on file    Relationship status: Not on file  Other Topics Concern  . Not on file  Social History Narrative  . Not on file     Family History: The patient's family history includes Arthritis in her sister. ROS:   Please see the history of present illness.    All 14 point review of systems negative except as described per history of present illness.  EKGs/Labs/Other Studies Reviewed:    The following studies were reviewed today: Stress test showing ischemia in LAD territory with chest pain she walk 3 minutes 49 seconds and started Bruce protocol heart rate  achieved 125 bpm which is only 74% maximum pressure heart rate for his age group.  Resting echo was normal however peak exercise images showed hypokinesia mid and distal septum apex of the anterior wall  EKG:  EKG is  ordered today.  The ekg ordered today demonstrates sinus bradycardia rate 51 incomplete right bundle branch block nonspecific ST-T segment changes  Recent Labs: 08/01/2017: ALT 20; BUN 16; Creatinine, Ser 0.70; Hemoglobin 13.0; Platelets 217.0; Potassium 3.5; Sodium 137 10/16/2017: TSH 1.04  Recent Lipid Panel    Component Value Date/Time   CHOL 169 08/01/2017 1210   TRIG 147.0 08/01/2017 1210   HDL 62.30 08/01/2017 1210   CHOLHDL 3 08/01/2017 1210   VLDL 29.4 08/01/2017 1210   LDLCALC 77 08/01/2017 1210    Physical Exam:    VS:  BP 122/80 (BP Location: Left Arm)   Pulse (!) 48   Ht 5\' 2"  (1.575 m)   Wt 159 lb (72.1 kg)   LMP  (LMP Unknown)   BMI 29.08 kg/m     Wt Readings from Last 3 Encounters:  01/04/18 159 lb (72.1 kg)  10/31/17 165 lb 6 oz (75 kg)  10/16/17 164 lb 6 oz (74.6 kg)     GEN:  Well nourished, well developed in no acute distress HEENT: Normal NECK: No JVD; No carotid bruits LYMPHATICS: No lymphadenopathy CARDIAC: RRR, no murmurs, no rubs, no gallops RESPIRATORY:  Clear to auscultation without rales, wheezing or rhonchi  ABDOMEN: Soft, non-tender, non-distended MUSCULOSKELETAL:  No edema; No deformity  SKIN: Warm and dry NEUROLOGIC:  Alert and oriented x 3 PSYCHIATRIC:  Normal affect   ASSESSMENT:    1. Anxiety   2. Angina pectoris (HCC)   3. Abnormal stress test    PLAN:    In order of problems listed above:  1. Angina pectoris with abnormal stress test.  Had a long discussion with her about what to do with the situation we discussed option of medical therapy versus cardiac catheterization and I strongly recommend proceed with cardiac catheterization.  She agree procedure was explained to her including all risk benefits as well as  alternatives we will aim for cardiac catheterization at the beginning of next week.  I asked her to start taking one baby aspirin every single day she does have nitroglycerin however she did not have need to use it she says she does not get to the level of exercise that provoke the pain like he did before.  I will give her ranolazine 500 mg twice daily.  I will also give her Lipitor 40 mg daily.  I told her if she  still having pain at rest or pain got worse or pain is not relieved by nitroglycerin she must go to the emergency room.  Overall young lady with exertional chest pain abnormal stress test will proceed with cardiac catheterization.   Medication Adjustments/Labs and Tests Ordered: Current medicines are reviewed at length with the patient today.  Concerns regarding medicines are outlined above.  No orders of the defined types were placed in this encounter.  No orders of the defined types were placed in this encounter.   Signed, Georgeanna Leaobert J. Krasowski, MD, [facility] Center[facility]ACC. 01/04/2018 10:03 AM    [facility]

## 2018-01-04 NOTE — Patient Instructions (Addendum)
Medication Instructions:  Your physician has recommended you make the following change in your medication:   START aspirin 81 mg daily START ranolazine (ranexa) 500 mg twice daily START atorvastatin 40 mg daily   CHANGE metoprolol tartrate 12.5 mg (0.5 tablet) twice daily   Labwork: Your physician recommends that you return for lab work today: CBC, BMP.   Testing/Procedures: You had an EKG today.      [facility] CARDIOVASCULAR DIV[facility] [place] [address], [address] [place] [address] Dept: [phone] Loc: [phone]  Chasty Randal  01/04/2018  You are scheduled for a Cardiac Catheterization on Monday, August 5 with Dr. Verne Carrow.  1. Please arrive at the [facility] (Main Entrance A) at [facility]: [address] [place], [place] [address] at 8:30 AM (This time is two hours before your procedure to ensure your preparation). Free valet parking service is available.   Special note: Every effort is made to have your procedure done on time. Please understand that emergencies sometimes delay scheduled procedures.  2. Diet: Do not eat solid foods after midnight.  The patient may have clear liquids until 5am upon the day of the procedure.  3. Labs: None needed  4. Medication instructions in preparation for your procedure:   On the morning of your procedure, take your Aspirin and any morning medicines NOT listed above.  You may use sips of water.  5. Plan for one night stay--bring personal belongings. 6. Bring a current list of your medications and current insurance cards. 7. You MUST have a responsible person to drive you home. 8. Someone MUST be with you the first 24 hours after you arrive home or your discharge will be delayed. 9. Please wear clothes that are easy to get on and off and wear slip-on shoes.  Thank you for allowing [place] to care for you!   -- [facility]  [facility]   Follow-Up: Your physician recommends that you schedule a follow-up appointment in: 3 weeks.  If you need a refill on your cardiac medications before your next appointment, please call your pharmacy.   Thank you for choosing [facility]! Mady Gemma, RN [phone]    Coronary Angiogram With Stent Coronary angiogram with stent placement is a procedure to widen or open a narrow blood vessel of the heart (coronary artery). Arteries may become blocked by cholesterol buildup (plaques) in the lining of the wall. When a coronary artery becomes partially blocked, blood flow to that area decreases. This may lead to chest pain or a heart attack (myocardial infarction). A stent is a small piece of metal that looks like mesh or a spring. Stent placement may be done as treatment for a heart attack or right after a coronary angiogram in which a blocked artery is found. Let your health care provider know about:  Any allergies you have.  All medicines you are taking, including vitamins, herbs, eye drops, creams, and over-the-counter medicines.  Any problems you or family members have had with anesthetic medicines.  Any blood disorders you have.  Any surgeries you have had.  Any medical conditions you have.  Whether you are pregnant or may be pregnant. What are the risks? Generally, this is a safe procedure. However, problems may occur, including:  Damage to the heart or its blood vessels.  A return of blockage.  Bleeding, infection, or bruising at the insertion site.  A collection of blood under the skin (hematoma)  at the insertion site.  A blood clot in another part of the body.  Kidney injury.  Allergic reaction to the dye or contrast that is used.  Bleeding into the abdomen (retroperitoneal bleeding).  What happens before the procedure? Staying hydrated Follow instructions from your health care provider about hydration, which may include:  Up to 2  hours before the procedure - you may continue to drink clear liquids, such as water, clear fruit juice, black coffee, and plain tea.  Eating and drinking restrictions Follow instructions from your health care provider about eating and drinking, which may include:  8 hours before the procedure - stop eating heavy meals or foods such as meat, fried foods, or fatty foods.  6 hours before the procedure - stop eating light meals or foods, such as toast or cereal.  2 hours before the procedure - stop drinking clear liquids.  Ask your health care provider about:  Changing or stopping your regular medicines. This is especially important if you are taking diabetes medicines or blood thinners.  Taking medicines such as ibuprofen. These medicines can thin your blood. Do not take these medicines before your procedure if your health care provider instructs you not to. Generally, aspirin is recommended before a procedure of passing a small, thin tube (catheter) through a blood vessel and into the heart (cardiac catheterization).  What happens during the procedure?  An IV tube will be inserted into one of your veins.  You will be given one or more of the following: ? A medicine to help you relax (sedative). ? A medicine to numb the area where the catheter will be inserted into an artery (local anesthetic).  To reduce your risk of infection: ? Your health care team will wash or sanitize their hands. ? Your skin will be washed with soap. ? Hair may be removed from the area where the catheter will be inserted.  Using a guide wire, the catheter will be inserted into an artery. The location may be in your groin, in your wrist, or in the fold of your arm (near your elbow).  A type of X-ray (fluoroscopy) will be used to help guide the catheter to the opening of the arteries in the heart.  A dye will be injected into the catheter, and X-rays will be taken. The dye will help to show where any narrowing or  blockages are located in the arteries.  A tiny wire will be guided to the blocked spot, and a balloon will be inflated to make the artery wider.  The stent will be expanded and will crush the plaques into the wall of the vessel. The stent will hold the area open and improve the blood flow. Most stents have a drug coating to reduce the risk of the stent narrowing over time.  The artery may be made wider using a drill, laser, or other tools to remove plaques.  When the blood flow is better, the catheter will be removed. The lining of the artery will grow over the stent, which stays where it was placed. This procedure may vary among health care providers and hospitals. What happens after the procedure?  If the procedure is done through the leg, you will be kept in bed lying flat for about 6 hours. You will be instructed to not bend and not cross your legs.  The insertion site will be checked frequently.  The pulse in your foot or wrist will be checked frequently.  You may have additional blood  tests, X-rays, and a test that records the electrical activity of your heart (electrocardiogram, or ECG). This information is not intended to replace advice given to you by your health care provider. Make sure you discuss any questions you have with your health care provider. Document Released: 11/26/2002 Document Revised: 01/20/2016 Document Reviewed: 12/26/2015 Elsevier Interactive Patient Education  2018 [employer].   Ranolazine tablets, extended release What is this medicine? RANOLAZINE (ra NOE la zeen) is a heart medicine. It is used to treat chronic chest pain (angina). This medicine must be taken regularly. It will not relieve an acute episode of chest pain. This medicine may be used for other purposes; ask your health care provider or pharmacist if you have questions. COMMON BRAND NAME(S): Ranexa What should I tell my health care provider before I take this medicine? They need to know if  you have any of these conditions: -heart disease -irregular heartbeat -kidney disease -liver disease -low levels of potassium or magnesium in the blood -an unusual or allergic reaction to ranolazine, other medicines, foods, dyes, or preservatives -pregnant or trying to get pregnant -breast-feeding How should I use this medicine? Take this medicine by mouth with a glass of water. Follow the directions on the prescription label. Do not cut, crush, or chew this medicine. Take with or without food. Do not take this medication with grapefruit juice. Take your doses at regular intervals. Do not take your medicine more often then directed. Talk to your pediatrician regarding the use of this medicine in children. Special care may be needed. Overdosage: If you think you have taken too much of this medicine contact a poison control center or emergency room at once. NOTE: This medicine is only for you. Do not share this medicine with others. What if I miss a dose? If you miss a dose, take it as soon as you can. If it is almost time for your next dose, take only that dose. Do not take double or extra doses. What may interact with this medicine? Do not take this medicine with any of the following medications: -antivirals for HIV or AIDS -cerivastatin -certain antibiotics like chloramphenicol, clarithromycin, dalfopristin; quinupristin, isoniazid, rifabutin, rifampin, rifapentine -certain medicines used for cancer like imatinib, nilotinib -certain medicines for fungal infections like fluconazole, itraconazole, ketoconazole, posaconazole, voriconazole -certain medicines for irregular heart beat like dofetilide, dronedarone -certain medicines for seizures like carbamazepine, fosphenytoin, oxcarbazepine, phenobarbital, phenytoin -cisapride -conivaptan -cyclosporine -grapefruit or grapefruit juice -lumacaftor; ivacaftor -nefazodone -pimozide -quinacrine -St John's  wort -thioridazine -ziprasidone This medicine may also interact with the following medications: -alfuzosin -certain medicines for depression, anxiety, or psychotic disturbances like bupropion, citalopram, fluoxetine, fluphenazine, paroxetine, perphenazine, risperidone, sertraline, trifluoperazine -certain medicines for cholesterol like atorvastatin, lovastatin, simvastatin -certain medicines for stomach problems like octreotide, palonosetron, prochlorperazine -eplerenone -ergot alkaloids like dihydroergotamine, ergonovine, ergotamine, methylergonovine -metformin -nicardipine -other medicines that prolong the QT interval (cause an abnormal heart rhythm) -sirolimus -tacrolimus This list may not describe all possible interactions. Give your health care provider a list of all the medicines, herbs, non-prescription drugs, or dietary supplements you use. Also tell them if you smoke, drink alcohol, or use illegal drugs. Some items may interact with your medicine. What should I watch for while using this medicine? Visit your doctor for regular check ups. Tell your doctor or healthcare professional if your symptoms do not start to get better or if they get worse. This medicine will not relieve an acute attack of angina or chest pain. This medicine can change your  heart rhythm. Your health care provider may check your heart rhythm by ordering an electrocardiogram (ECG) while you are taking this medicine. You may get drowsy or dizzy. Do not drive, use machinery, or do anything that needs mental alertness until you know how this medicine affects you. Do not stand or sit up quickly, especially if you are an older patient. This reduces the risk of dizzy or fainting spells. Alcohol may interfere with the effect of this medicine. Avoid alcoholic drinks. If you are scheduled for any medical or dental procedure, tell your healthcare provider that you are taking this medicine. This medicine can interact with other  medicines used during surgery. What side effects may I notice from receiving this medicine? Side effects that you should report to your doctor or health care professional as soon as possible: -allergic reactions like skin rash, itching or hives, swelling of the face, lips, or tongue -breathing problems -changes in vision -fast, irregular or pounding heartbeat -feeling faint or lightheaded, falls -low or high blood pressure -numbness or tingling feelings -ringing in the ears -tremor or shakiness -slow heartbeat (fewer than 50 beats per minute) -swelling of the legs or feet Side effects that usually do not require medical attention (report to your doctor or health care professional if they continue or are bothersome): -constipation -drowsy -dry mouth -headache -nausea or vomiting -stomach upset This list may not describe all possible side effects. Call your doctor for medical advice about side effects. You may report side effects to FDA at 1-800-FDA-1088. Where should I keep my medicine? Keep out of the reach of children. Store at room temperature between 15 and 30 degrees C (59 and 86 degrees F). Throw away any unused medicine after the expiration date. NOTE: This sheet is a summary. It may not cover all possible information. If you have questions about this medicine, talk to your doctor, pharmacist, or health care provider.  2018 Elsevier/Gold Standard (2015-06-24 12:24:15)    Atorvastatin tablets What is this medicine? ATORVASTATIN (a TORE va sta tin) is known as a HMG-CoA reductase inhibitor or 'statin'. It lowers the level of cholesterol and triglycerides in the blood. This drug may also reduce the risk of heart attack, stroke, or other health problems in patients with risk factors for heart disease. Diet and lifestyle changes are often used with this drug. This medicine may be used for other purposes; ask your health care provider or pharmacist if you have questions. COMMON  BRAND NAME(S): Lipitor What should I tell my health care provider before I take this medicine? They need to know if you have any of these conditions: -frequently drink alcoholic beverages -history of stroke, TIA -kidney disease -liver disease -muscle aches or weakness -other medical condition -an unusual or allergic reaction to atorvastatin, other medicines, foods, dyes, or preservatives -pregnant or trying to get pregnant -breast-feeding How should I use this medicine? Take this medicine by mouth with a glass of water. Follow the directions on the prescription label. You can take this medicine with or without food. Take your doses at regular intervals. Do not take your medicine more often than directed. Talk to your pediatrician regarding the use of this medicine in children. While this drug may be prescribed for children as young as 52 years old for selected conditions, precautions do apply. Overdosage: If you think you have taken too much of this medicine contact a poison control center or emergency room at once. NOTE: This medicine is only for you. Do not share this  medicine with others. What if I miss a dose? If you miss a dose, take it as soon as you can. If it is almost time for your next dose, take only that dose. Do not take double or extra doses. What may interact with this medicine? Do not take this medicine with any of the following medications: -red yeast rice -telaprevir -telithromycin -voriconazole This medicine may also interact with the following medications: -alcohol -antiviral medicines for HIV or AIDS -boceprevir -certain antibiotics like clarithromycin, erythromycin, troleandomycin -certain medicines for cholesterol like fenofibrate or gemfibrozil -cimetidine -clarithromycin -colchicine -cyclosporine -digoxin -female hormones, like estrogens or progestins and birth control pills -grapefruit juice -medicines for fungal infections like fluconazole,  itraconazole, ketoconazole -niacin -rifampin -spironolactone This list may not describe all possible interactions. Give your health care provider a list of all the medicines, herbs, non-prescription drugs, or dietary supplements you use. Also tell them if you smoke, drink alcohol, or use illegal drugs. Some items may interact with your medicine. What should I watch for while using this medicine? Visit your doctor or health care professional for regular check-ups. You may need regular tests to make sure your liver is working properly. Tell your doctor or health care professional right away if you get any unexplained muscle pain, tenderness, or weakness, especially if you also have a fever and tiredness. Your doctor or health care professional may tell you to stop taking this medicine if you develop muscle problems. If your muscle problems do not go away after stopping this medicine, contact your health care professional. This drug is only part of a total heart-health program. Your doctor or a dietician can suggest a low-cholesterol and low-fat diet to help. Avoid alcohol and smoking, and keep a proper exercise schedule. Do not use this drug if you are pregnant or breast-feeding. Serious side effects to an unborn child or to an infant are possible. Talk to your doctor or pharmacist for more information. This medicine may affect blood sugar levels. If you have diabetes, check with your doctor or health care professional before you change your diet or the dose of your diabetic medicine. If you are going to have surgery tell your health care professional that you are taking this drug. What side effects may I notice from receiving this medicine? Side effects that you should report to your doctor or health care professional as soon as possible: -allergic reactions like skin rash, itching or hives, swelling of the face, lips, or tongue -dark urine -fever -joint pain -muscle cramps, pain -redness,  blistering, peeling or loosening of the skin, including inside the mouth -trouble passing urine or change in the amount of urine -unusually weak or tired -yellowing of eyes or skin Side effects that usually do not require medical attention (report to your doctor or health care professional if they continue or are bothersome): -constipation -heartburn -stomach gas, pain, upset This list may not describe all possible side effects. Call your doctor for medical advice about side effects. You may report side effects to FDA at 1-800-FDA-1088. Where should I keep my medicine? Keep out of the reach of children. Store at room temperature between 20 to 25 degrees C (68 to 77 degrees F). Throw away any unused medicine after the expiration date. NOTE: This sheet is a summary. It may not cover all possible information. If you have questions about this medicine, talk to your doctor, pharmacist, or health care provider.  2018 Elsevier/Gold Standard (2011-04-11 16:10:96)    Aspirin and Your Heart Aspirin  is a medicine that affects the way blood clots. Aspirin can be used to help reduce the risk of blood clots, heart attacks, and other heart-related problems. Should I take aspirin? Your health care provider will help you determine whether it is safe and beneficial for you to take aspirin daily. Taking aspirin daily may be beneficial if you:  Have had a heart attack or chest pain.  Have undergone open heart surgery such as coronary artery bypass surgery (CABG).  Have had coronary angioplasty.  Have experienced a stroke or transient ischemic attack (TIA).  Have peripheral vascular disease (PVD).  Have chronic heart rhythm problems such as atrial fibrillation.  Are there any risks of taking aspirin daily? Daily use of aspirin can increase your risk of side effects. Some of these include:  Bleeding. Bleeding problems can be minor or serious. An example of a minor problem is a cut that does not stop  bleeding. An example of a more serious problem is stomach bleeding or bleeding into the brain. Your risk of bleeding is increased if you are also taking non-steroidal anti-inflammatory medicine (NSAIDs).  Increased bruising.  Upset stomach.  An allergic reaction. People who have nasal polyps have an increased risk of developing an aspirin allergy.  What are some guidelines I should follow when taking aspirin?  Take aspirin only as directed by your health care provider. Make sure you understand how much you should take and what form you should take. The two forms of aspirin are: ? Non-enteric-coated. This type of aspirin does not have a coating and is absorbed quickly. Non-enteric-coated aspirin is usually recommended for people with chest pain. This type of aspirin also comes in a chewable form. ? Enteric-coated. This type of aspirin has a special coating that releases the medicine very slowly. Enteric-coated aspirin causes less stomach upset than non-enteric-coated aspirin. This type of aspirin should not be chewed or crushed.  Drink alcohol in moderation. Drinking alcohol increases your risk of bleeding. When should I seek medical care?  You have unusual bleeding or bruising.  You have stomach pain.  You have an allergic reaction. Symptoms of an allergic reaction include: ? Hives. ? Itchy skin. ? Swelling of the lips, tongue, or face.  You have ringing in your ears. When should I seek immediate medical care?  Your bowel movements are bloody, dark red, or black in color.  You vomit or cough up blood.  You have blood in your urine.  You cough, wheeze, or feel short of breath. If you have any of the following symptoms, this is an emergency. Do not wait to see if the pain will go away. Get medical help at once. Call your local emergency services (911 in the [place] Do not drive yourself to the hospital.  You have severe chest pain, especially if the pain is crushing or  pressure-like and spreads to the arms, back, neck, or jaw.  You have stroke-like symptoms, such as: ? Loss of vision. ? Difficulty talking. ? Numbness or weakness on one side of your body. ? Numbness or weakness in your arm or leg. ? Not thinking clearly or feeling confused.  This information is not intended to replace advice given to you by your health care provider. Make sure you discuss any questions you have with your health care provider. Document Released: 05/04/2008 Document Revised: 09/29/2015 Document Reviewed: 08/27/2013 Elsevier Interactive Patient Education  2018 [employer].   Metoprolol tablets What is this medicine? METOPROLOL (me TOE proe lole) is  a beta-blocker. Beta-blockers reduce the workload on the heart and help it to beat more regularly. This medicine is used to treat high blood pressure and to prevent chest pain. It is also used to after a heart attack and to prevent an additional heart attack from occurring. This medicine may be used for other purposes; ask your health care provider or pharmacist if you have questions. COMMON BRAND NAME(S): Lopressor What should I tell my health care provider before I take this medicine? They need to know if you have any of these conditions: -diabetes -heart or vessel disease like slow heart rate, worsening heart failure, heart block, sick sinus syndrome or Raynaud's disease -kidney disease -liver disease -lung or breathing disease, like asthma or emphysema -pheochromocytoma -thyroid disease -an unusual or allergic reaction to metoprolol, other beta-blockers, medicines, foods, dyes, or preservatives -pregnant or trying to get pregnant -breast-feeding How should I use this medicine? Take this medicine by mouth with a drink of water. Follow the directions on the prescription label. Take this medicine immediately after meals. Take your doses at regular intervals. Do not take more medicine than directed. Do not stop taking this  medicine suddenly. This could lead to serious heart-related effects. Talk to your pediatrician regarding the use of this medicine in children. Special care may be needed. Overdosage: If you think you have taken too much of this medicine contact a poison control center or emergency room at once. NOTE: This medicine is only for you. Do not share this medicine with others. What if I miss a dose? If you miss a dose, take it as soon as you can. If it is almost time for your next dose, take only that dose. Do not take double or extra doses. What may interact with this medicine? This medicine may interact with the following medications: -certain medicines for blood pressure, heart disease, irregular heart beat -certain medicines for depression like monoamine oxidase (MAO) inhibitors, fluoxetine, or paroxetine -clonidine -dobutamine -epinephrine -isoproterenol -reserpine This list may not describe all possible interactions. Give your health care provider a list of all the medicines, herbs, non-prescription drugs, or dietary supplements you use. Also tell them if you smoke, drink alcohol, or use illegal drugs. Some items may interact with your medicine. What should I watch for while using this medicine? Visit your doctor or health care professional for regular check ups. Contact your doctor right away if your symptoms worsen. Check your blood pressure and pulse rate regularly. Ask your health care professional what your blood pressure and pulse rate should be, and when you should contact them. You may get drowsy or dizzy. Do not drive, use machinery, or do anything that needs mental alertness until you know how this medicine affects you. Do not sit or stand up quickly, especially if you are an older patient. This reduces the risk of dizzy or fainting spells. Contact your doctor if these symptoms continue. Alcohol may interfere with the effect of this medicine. Avoid alcoholic drinks. What side effects may  I notice from receiving this medicine? Side effects that you should report to your doctor or health care professional as soon as possible: -allergic reactions like skin rash, itching or hives -cold or numb hands or feet -depression -difficulty breathing -faint -fever with sore throat -irregular heartbeat, chest pain -rapid weight gain -swollen legs or ankles Side effects that usually do not require medical attention (report to your doctor or health care professional if they continue or are bothersome): -anxiety or nervousness -change in  sex drive or performance -dry skin -headache -nightmares or trouble sleeping -short term memory loss -stomach upset or diarrhea -unusually tired This list may not describe all possible side effects. Call your doctor for medical advice about side effects. You may report side effects to FDA at 1-800-FDA-1088. Where should I keep my medicine? Keep out of the reach of children. Store at room temperature between 15 and 30 degrees C (59 and 86 degrees F). Throw away any unused medicine after the expiration date. NOTE: This sheet is a summary. It may not cover all possible information. If you have questions about this medicine, talk to your doctor, pharmacist, or health care provider.  2018 Elsevier/Gold Standard (2013-01-24 14:40:36)

## 2018-01-07 ENCOUNTER — Encounter (HOSPITAL_COMMUNITY): Payer: Self-pay | Admitting: Cardiovascular Disease

## 2018-01-07 ENCOUNTER — Other Ambulatory Visit: Payer: Self-pay

## 2018-01-07 ENCOUNTER — Ambulatory Visit (HOSPITAL_COMMUNITY)
Admission: RE | Disposition: A | Discharge status: Home / Self Care | Payer: Self-pay | Source: Ambulatory Visit | Attending: Cardiovascular Disease

## 2018-01-07 ENCOUNTER — Ambulatory Visit (HOSPITAL_COMMUNITY)
Admission: RE | Admit: 2018-01-07 | Discharge: 2018-01-08 | Disposition: A | Discharge status: Home / Self Care | Payer: Federal, State, Local not specified - PPO | Source: Ambulatory Visit | Attending: Cardiovascular Disease | Admitting: Cardiovascular Disease

## 2018-01-07 DIAGNOSIS — I2582 Chronic total occlusion of coronary artery: Secondary | ICD-10-CM | POA: Insufficient documentation

## 2018-01-07 DIAGNOSIS — R9439 Abnormal result of other cardiovascular function study: Secondary | ICD-10-CM | POA: Diagnosis not present

## 2018-01-07 DIAGNOSIS — Z9889 Other specified postprocedural states: Secondary | ICD-10-CM | POA: Insufficient documentation

## 2018-01-07 DIAGNOSIS — Z7982 Long term (current) use of aspirin: Secondary | ICD-10-CM | POA: Diagnosis not present

## 2018-01-07 DIAGNOSIS — I2510 Atherosclerotic heart disease of native coronary artery without angina pectoris: Secondary | ICD-10-CM

## 2018-01-07 DIAGNOSIS — E079 Disorder of thyroid, unspecified: Secondary | ICD-10-CM | POA: Diagnosis not present

## 2018-01-07 DIAGNOSIS — Z955 Presence of coronary angioplasty implant and graft: Secondary | ICD-10-CM

## 2018-01-07 DIAGNOSIS — Z79890 Hormone replacement therapy (postmenopausal): Secondary | ICD-10-CM | POA: Insufficient documentation

## 2018-01-07 DIAGNOSIS — Z79899 Other long term (current) drug therapy: Secondary | ICD-10-CM | POA: Diagnosis not present

## 2018-01-07 DIAGNOSIS — I200 Unstable angina: Secondary | ICD-10-CM

## 2018-01-07 DIAGNOSIS — I25110 Atherosclerotic heart disease of native coronary artery with unstable angina pectoris: Secondary | ICD-10-CM | POA: Diagnosis not present

## 2018-01-07 HISTORY — DX: Atherosclerotic heart disease of native coronary artery without angina pectoris: I25.10

## 2018-01-07 HISTORY — PX: CORONARY STENT INTERVENTION: CATH118234

## 2018-01-07 HISTORY — DX: Unstable angina: I20.0

## 2018-01-07 HISTORY — PX: LEFT HEART CATH AND CORONARY ANGIOGRAPHY: CATH118249

## 2018-01-07 LAB — POCT ACTIVATED CLOTTING TIME: ACTIVATED CLOTTING TIME: 340 s

## 2018-01-07 SURGERY — LEFT HEART CATH AND CORONARY ANGIOGRAPHY
Anesthesia: LOCAL

## 2018-01-07 MED ORDER — ANGIOPLASTY BOOK
Freq: Once | Status: DC
  Filled 2018-01-07: qty 1

## 2018-01-07 MED ORDER — TICAGRELOR 90 MG PO TABS
90.0000 mg | ORAL_TABLET | Freq: Two times a day (BID) | ORAL | Status: DC
  Administered 2018-01-07 – 2018-01-08 (×2): 90 mg via ORAL
  Filled 2018-01-07 (×2): qty 1

## 2018-01-07 MED ORDER — HEPARIN SODIUM (PORCINE) 1000 UNIT/ML IJ SOLN
INTRAMUSCULAR | Status: DC | PRN
  Administered 2018-01-07: 3500 [IU] via INTRAVENOUS
  Administered 2018-01-07: 6500 [IU] via INTRAVENOUS

## 2018-01-07 MED ORDER — NITROGLYCERIN 1 MG/10 ML FOR IR/CATH LAB
INTRA_ARTERIAL | Status: AC
  Filled 2018-01-07: qty 10

## 2018-01-07 MED ORDER — SODIUM CHLORIDE 0.9% FLUSH: 3.0000 mL | Freq: Two times a day (BID) | INTRAVENOUS | Status: DC

## 2018-01-07 MED ORDER — SODIUM CHLORIDE 0.9% FLUSH: 3.0000 mL | INTRAVENOUS | Status: DC | PRN

## 2018-01-07 MED ORDER — SODIUM CHLORIDE 0.9 % IV SOLN: 250.0000 mL | INTRAVENOUS | Status: DC | PRN

## 2018-01-07 MED ORDER — NITROGLYCERIN 0.4 MG SL SUBL: 0.4000 mg | SUBLINGUAL_TABLET | SUBLINGUAL | Status: DC | PRN

## 2018-01-07 MED ORDER — TICAGRELOR 90 MG PO TABS
ORAL_TABLET | ORAL | Status: DC | PRN
  Administered 2018-01-07: 180 mg via ORAL

## 2018-01-07 MED ORDER — VERAPAMIL HCL 2.5 MG/ML IV SOLN
INTRAVENOUS | Status: AC
  Filled 2018-01-07: qty 2

## 2018-01-07 MED ORDER — LIDOCAINE HCL (PF) 1 % IJ SOLN
INTRAMUSCULAR | Status: AC
  Filled 2018-01-07: qty 30

## 2018-01-07 MED ORDER — FENTANYL CITRATE (PF) 100 MCG/2ML IJ SOLN
INTRAMUSCULAR | Status: DC | PRN
  Administered 2018-01-07 (×2): 25 ug via INTRAVENOUS

## 2018-01-07 MED ORDER — TICAGRELOR 90 MG PO TABS
ORAL_TABLET | ORAL | Status: AC
  Filled 2018-01-07: qty 2

## 2018-01-07 MED ORDER — LIDOCAINE HCL (PF) 1 % IJ SOLN
INTRAMUSCULAR | Status: DC | PRN
  Administered 2018-01-07: 2 mL via INTRADERMAL

## 2018-01-07 MED ORDER — HEPARIN SODIUM (PORCINE) 1000 UNIT/ML IJ SOLN
INTRAMUSCULAR | Status: AC
  Filled 2018-01-07: qty 1

## 2018-01-07 MED ORDER — IOHEXOL 350 MG/ML SOLN
INTRAVENOUS | Status: DC | PRN
  Administered 2018-01-07: 130 mL

## 2018-01-07 MED ORDER — THE SENSUOUS HEART BOOK
Freq: Once | Status: AC
  Administered 2018-01-07: 23:00:00
  Filled 2018-01-07: qty 1

## 2018-01-07 MED ORDER — SODIUM CHLORIDE 0.9 % IV SOLN
INTRAVENOUS | Status: AC
  Administered 2018-01-07: 12:00:00 via INTRAVENOUS

## 2018-01-07 MED ORDER — SODIUM CHLORIDE 0.9 % WEIGHT BASED INFUSION: 1.0000 mL/kg/h | INTRAVENOUS | Status: DC

## 2018-01-07 MED ORDER — ASPIRIN 81 MG PO CHEW: 81.0000 mg | CHEWABLE_TABLET | ORAL | Status: DC

## 2018-01-07 MED ORDER — PANTOPRAZOLE SODIUM 40 MG PO TBEC
40.0000 mg | DELAYED_RELEASE_TABLET | Freq: Every day | ORAL | Status: DC
  Administered 2018-01-07 – 2018-01-08 (×2): 40 mg via ORAL
  Filled 2018-01-07 (×2): qty 1

## 2018-01-07 MED ORDER — METOPROLOL TARTRATE 12.5 MG HALF TABLET
12.5000 mg | ORAL_TABLET | Freq: Two times a day (BID) | ORAL | Status: DC
  Administered 2018-01-07 – 2018-01-08 (×3): 12.5 mg via ORAL
  Filled 2018-01-07 (×3): qty 1

## 2018-01-07 MED ORDER — MIDAZOLAM HCL 2 MG/2ML IJ SOLN
INTRAMUSCULAR | Status: DC | PRN
  Administered 2018-01-07 (×2): 1 mg via INTRAVENOUS

## 2018-01-07 MED ORDER — FENTANYL CITRATE (PF) 100 MCG/2ML IJ SOLN
INTRAMUSCULAR | Status: AC
  Filled 2018-01-07: qty 2

## 2018-01-07 MED ORDER — SODIUM CHLORIDE 0.9 % WEIGHT BASED INFUSION
3.0000 mL/kg/h | INTRAVENOUS | Status: DC
  Administered 2018-01-07: 3 mL/kg/h via INTRAVENOUS

## 2018-01-07 MED ORDER — MIDAZOLAM HCL 2 MG/2ML IJ SOLN
INTRAMUSCULAR | Status: AC
  Filled 2018-01-07: qty 2

## 2018-01-07 MED ORDER — ACETAMINOPHEN 325 MG PO TABS
650.0000 mg | ORAL_TABLET | ORAL | Status: DC | PRN
Start: 2018-01-07 — End: 2018-01-08

## 2018-01-07 MED ORDER — ATORVASTATIN CALCIUM 40 MG PO TABS
40.0000 mg | ORAL_TABLET | Freq: Every day | ORAL | Status: DC
  Administered 2018-01-07: 40 mg via ORAL
  Filled 2018-01-07: qty 1

## 2018-01-07 MED ORDER — LEVOTHYROXINE SODIUM 75 MCG PO TABS
75.0000 ug | ORAL_TABLET | Freq: Every day | ORAL | Status: DC
Start: 2018-01-08 — End: 2018-01-08
  Administered 2018-01-08: 07:00:00 75 ug via ORAL
  Filled 2018-01-07: qty 1

## 2018-01-07 MED ORDER — SODIUM CHLORIDE 0.9% FLUSH
3.0000 mL | Freq: Two times a day (BID) | INTRAVENOUS | Status: DC
  Administered 2018-01-07 (×2): 3 mL via INTRAVENOUS

## 2018-01-07 MED ORDER — RANOLAZINE ER 500 MG PO TB12
500.0000 mg | ORAL_TABLET | Freq: Two times a day (BID) | ORAL | Status: DC
  Administered 2018-01-07 – 2018-01-08 (×3): 500 mg via ORAL
  Filled 2018-01-07 (×3): qty 1

## 2018-01-07 MED ORDER — LABETALOL HCL 5 MG/ML IV SOLN: 10.0000 mg | INTRAVENOUS | Status: AC | PRN

## 2018-01-07 MED ORDER — HEPARIN (PORCINE) IN NACL 1000-0.9 UT/500ML-% IV SOLN
INTRAVENOUS | Status: AC
  Filled 2018-01-07: qty 1000

## 2018-01-07 MED ORDER — HEPARIN (PORCINE) IN NACL 1000-0.9 UT/500ML-% IV SOLN
INTRAVENOUS | Status: DC | PRN
  Administered 2018-01-07 (×2): 500 mL

## 2018-01-07 MED ORDER — HYDRALAZINE HCL 20 MG/ML IJ SOLN: 5.0000 mg | INTRAMUSCULAR | Status: AC | PRN

## 2018-01-07 MED ORDER — ASPIRIN EC 81 MG PO TBEC
81.0000 mg | DELAYED_RELEASE_TABLET | Freq: Every day | ORAL | Status: DC
  Administered 2018-01-08: 81 mg via ORAL
  Filled 2018-01-07: qty 1

## 2018-01-07 MED ORDER — ONDANSETRON HCL 4 MG/2ML IJ SOLN: 4.0000 mg | Freq: Four times a day (QID) | INTRAMUSCULAR | Status: DC | PRN

## 2018-01-07 MED ORDER — VERAPAMIL HCL 2.5 MG/ML IV SOLN
INTRAVENOUS | Status: DC | PRN
  Administered 2018-01-07: 10 mL via INTRA_ARTERIAL

## 2018-01-07 SURGICAL SUPPLY — 17 items
BALLN SAPPHIRE 2.0X12 (BALLOONS) ×2
BALLN SAPPHIRE ~~LOC~~ 2.5X15 (BALLOONS) ×1 IMPLANT
BALLOON SAPPHIRE 2.0X12 (BALLOONS) IMPLANT
CATH 5FR JL3.5 JR4 ANG PIG MP (CATHETERS) ×1 IMPLANT
CATH VISTA GUIDE 6FR XBLAD3.5 (CATHETERS) ×1 IMPLANT
DEVICE RAD COMP TR BAND LRG (VASCULAR PRODUCTS) ×1 IMPLANT
GLIDESHEATH SLEND SS 6F .021 (SHEATH) ×1 IMPLANT
GUIDEWIRE INQWIRE 1.5J.035X260 (WIRE) IMPLANT
INQWIRE 1.5J .035X260CM (WIRE) ×2
KIT ENCORE 26 ADVANTAGE (KITS) ×1 IMPLANT
KIT HEART LEFT (KITS) ×2 IMPLANT
PACK CARDIAC CATHETERIZATION (CUSTOM PROCEDURE TRAY) ×2 IMPLANT
STENT ORSIRO 2.25X26 (Permanent Stent) ×1 IMPLANT
SYR MEDRAD MARK V 150ML (SYRINGE) ×2 IMPLANT
TRANSDUCER W/STOPCOCK (MISCELLANEOUS) ×2 IMPLANT
TUBING CIL FLEX 10 FLL-RA (TUBING) ×2 IMPLANT
WIRE COUGAR XT STRL 190CM (WIRE) ×1 IMPLANT

## 2018-01-07 NOTE — Progress Notes (Signed)
TR BAND REMOVAL  LOCATION:    right radial  DEFLATED PER PROTOCOL:    Yes.    TIME BAND OFF / DRESSING APPLIED:    1545    SITE UPON ARRIVAL:    Level 0  SITE AFTER BAND REMOVAL:    Level 0  CIRCULATION SENSATION AND MOVEMENT:    Within Normal Limits   Yes.    COMMENTS:     

## 2018-01-07 NOTE — Progress Notes (Signed)
#   5.  S/ W  [facility] @ [facility] # [id number] OPT-MEMBER   1. BRILINTA 90 MG BID  COVER- YES  AND NONE PREFERRED  CO-PAY- $ 222.22  (60 % of total [place] )  TIER- 3 DRUG  PRIOR APPROVAL- NO  NO DEDUCTIBLE   ALTERNATIVE:  1. CLOPIDOGREL  75 MG BID  COVER- YES  CO-PAY- $ 6.69  TIER- 1 DRUG  PRIOR APPROVAL- NO   PREFERRED PHARMACY : YES  [facility]

## 2018-01-07 NOTE — H&P (Signed)
Cardiology Admission History and Physical:   Patient ID: Maria Myers; MRN: [id number]; DOB: March 31, 1966   Admission date: 01/07/2018  Primary Care Provider: Mliss Sax, MD Primary Cardiologist: No primary care provider on file. Maria Myers  Chief Complaint:  Chest pain/abnormal stress test  Patient Profile:   Maria Myers is a 52 y.o. female with a history of chest pain and abnormal stress test.   History of Present Illness:   Ms. Maria Myers is a 52 yo female with history of thyroid disease but no cardiac history with recent exertional chest pain. Stress test shows ischemia in the LAD territory. I cannot see this stress test. She was seen by Dr. Janyce Myers and cath was arranged.    Past Medical History:  Diagnosis Date  . Allergy   . Thyroid disease     Past Surgical History:  Procedure Laterality Date  . CESAREAN SECTION       Medications Prior to Admission: Prior to Admission medications   Medication Sig Start Date End Date Taking? Authorizing Provider  aspirin EC 81 MG tablet Take 1 tablet (81 mg total) by mouth daily. 01/04/18  Yes Maria Lea, MD  atorvastatin (LIPITOR) 40 MG tablet Take 1 tablet (40 mg total) by mouth daily at 6 PM. 01/04/18 04/04/18 Yes Maria Lea, MD  levothyroxine (SYNTHROID, LEVOTHROID) 75 MCG tablet Take 1 tablet (75 mcg total) by mouth daily before breakfast. 10/17/17  Yes Maria Sax, MD  Multiple Vitamin (MULTIVITAMIN WITH MINERALS) TABS tablet Take 1 tablet by mouth 4 (four) times a week.    Yes [provider]  nitroGLYCERIN (NITROSTAT) 0.4 MG SL tablet Place 0.4 mg under the tongue every 5 (five) minutes as needed for chest pain.   Yes [provider]  omeprazole (PRILOSEC) 40 MG capsule Take 40 mg by mouth daily before supper.   Yes [provider]  ranolazine (RANEXA) 500 MG 12 hr tablet Take 1 tablet (500 mg total) by mouth 2 (two) times daily. 01/04/18  Yes Maria Lea, MD  Vitamin D,  Ergocalciferol, (DRISDOL) 50000 units CAPS capsule Take 1 capsule (50,000 Units total) by mouth every 7 (seven) days. Patient taking differently: Take 50,000 Units by mouth every Sunday.  09/06/17  Yes Maria Sax, MD  albuterol (PROVENTIL HFA;VENTOLIN HFA) 108 (90 Base) MCG/ACT inhaler Inhale 2 puffs into the lungs every 4 (four) hours as needed for wheezing or shortness of breath. Patient not taking: Reported on 01/04/2018 07/27/17   Maria Areas, PA-C  calcium-vitamin D (OSCAL WITH D) 500-200 MG-UNIT tablet Take 1 tablet by mouth 2 (two) times daily. Patient not taking: Reported on 01/04/2018 10/17/17   Maria Sax, MD  fluticasone [facility]) 50 MCG/ACT nasal spray Place 2 sprays into both nostrils daily. Patient not taking: Reported on 01/04/2018 07/23/17   Maria Sax, MD  metoprolol tartrate (LOPRESSOR) 25 MG tablet Take 0.5 tablets (12.5 mg total) by mouth 2 (two) times daily. 01/04/18 04/04/18  Maria Lea, MD  PARoxetine (PAXIL-CR) 12.5 MG 24 hr tablet TAKE 1 TABLET(12.5 MG) BY MOUTH DAILY Patient not taking: Reported on 01/04/2018 10/31/17   Maria Sax, MD     Allergies:   No Known Allergies  Social History:   Social History   Socioeconomic History  . Marital status: Married    Spouse name: Not on file  . Number of children: Not on file  . Years of education: Not on file  . Highest education level: Not on file  Occupational History  . Not on file  Social Needs  . Financial resource strain: Not on file  . Food insecurity:    Worry: Not on file    Inability: Not on file  . Transportation needs:    Medical: Not on file    Non-medical: Not on file  Tobacco Use  . Smoking status: Never Smoker  . Smokeless tobacco: Never Used  Substance and Sexual Activity  . Alcohol use: Not on file  . Drug use: No  . Sexual activity: Not on file  Lifestyle  . Physical activity:    Days per week: Not on file    Minutes per session: Not on file    . Stress: Not on file  Relationships  . Social connections:    Talks on phone: Not on file    Gets together: Not on file    Attends religious service: Not on file    Active member of club or organization: Not on file    Attends meetings of clubs or organizations: Not on file    Relationship status: Not on file  . Intimate partner violence:    Fear of current or ex partner: Not on file    Emotionally abused: Not on file    Physically abused: Not on file    Forced sexual activity: Not on file  Other Topics Concern  . Not on file  Social History Narrative  . Not on file    Family History:   The patient's family history includes Arthritis in her sister.    ROS:  Please see the history of present illness.  All other ROS reviewed and negative.     Physical Exam/Data:   Vitals:   01/07/18 0904  BP: 137/72  Pulse: (!) 50  Temp: 97.8 F (36.6 C)  TempSrc: Temporal  SpO2: 100%  Weight: 159 lb (72.1 kg)  Height: 5\' 2"  (1.575 m)   No intake or output data in the 24 hours ending 01/07/18 0936 Filed Weights   01/07/18 0904  Weight: 159 lb (72.1 kg)   Body mass index is 29.08 kg/m.  General:  Well nourished, well developed, in no acute distress HEENT: normal Lymph: no adenopathy Neck: no JVD Endocrine:  No thryomegaly Vascular: No carotid bruits; FA pulses 2+ bilaterally without bruits  Cardiac:  normal S1, S2; RRR; no murmur  Lungs:  clear to auscultation bilaterally, no wheezing, rhonchi or rales  Abd: soft, nontender, no hepatomegaly  Ext: no LE edema Musculoskeletal:  No deformities, BUE and BLE strength normal and equal Skin: warm and dry  Neuro:  CNs 2-12 intact, no focal abnormalities noted Psych:  Normal affect   Laboratory Data:  Chemistry Recent Labs  Lab 01/04/18 1040 01/04/18 1140  NA WILL FOLLOW 142  K WILL FOLLOW 4.5  CL WILL FOLLOW 102  CO2 WILL FOLLOW 25  GLUCOSE WILL FOLLOW 86  BUN WILL FOLLOW 11  CREATININE WILL FOLLOW 0.66  CALCIUM WILL  FOLLOW 9.4  GFRNONAA WILL FOLLOW 102  GFRAA WILL FOLLOW 117    No results for input(s): PROT, ALBUMIN, AST, ALT, ALKPHOS, BILITOT in the last 168 hours. Hematology Recent Labs  Lab 01/04/18 1040  WBC 6.4  RBC 5.00  HGB 13.7  HCT 41.2  MCV 82  MCH 27.4  MCHC 33.3  RDW 13.2  PLT 240   Cardiac EnzymesNo results for input(s): TROPONINI in the last 168 hours. No results for input(s): TROPIPOC in the last 168 hours.  BNPNo results for  input(s): BNP, PROBNP in the last 168 hours.  DDimer No results for input(s): DDIMER in the last 168 hours.  Radiology/Studies:  No results found.  Assessment and Plan:   1. Chest pain with abnormal stress test: Plan cardiac cath today with possible PCI  For questions or updates, please contact [facility] Please consult [email or web address] for contact info under Cardiology/STEMI.    Signed, Verne Carrowhristopher Ezechiel Stooksbury, MD  01/07/2018 9:36 AM

## 2018-01-07 NOTE — Care Management Note (Addendum)
Case Management Note  Patient Details  Name: Maria Myers MRN: [id number] Date of Birth: 11/23/1965  Subjective/Objective:  From home, s/p coronary stent intervention, will be on brilinta. Benefit check in process.   8/6 Letha Capeeborah Sarahbeth Cashin RN, BSN - NCM spoke with Heloise PurpuraLindsey PA, informed her of high copay, the patient will get the 30 day free savings coupon for brilinta and then they will discuss switching her to other medication at follow up visit. Rosanne AshingJim RN will give patient the 30 day savings coupon. Patient's pharmacy has brilinta in stock.                  Action/Plan: NCM will follow for transition of care needs.  Expected Discharge Date:                  Expected Discharge Plan:  Home/Self Care  In-House Referral:     Discharge planning Services  CM Consult, Medication Assistance  Post Acute Care Choice:    Choice offered to:     DME Arranged:    DME Agency:     HH Arranged:    HH Agency:     Status of Service:  Completed, signed off  If discussed at [employer] Length of Stay Meetings, dates discussed:    Additional Comments:  Leone Havenaylor, Belmira Daley Clinton, RN 01/07/2018, 12:41 PM

## 2018-01-07 NOTE — Interval H&P Note (Signed)
History and Physical Interval Note:  01/07/2018 9:36 AM  Maria Myers  has presented today for cardiac cath with the diagnosis of angina/abnormal stress test. The various methods of treatment have been discussed with the patient and family. After consideration of risks, benefits and other options for treatment, the patient has consented to  Procedure(s): LEFT HEART CATH AND CORONARY ANGIOGRAPHY (N/A) as a surgical intervention .  The patient's history has been reviewed, patient examined, no change in status, stable for surgery.  I have reviewed the patient's chart and labs.  Questions were answered to the patient's satisfaction.     Cath Lab Visit (complete for each Cath Lab visit)  Clinical Evaluation Leading to the Procedure:   ACS: No.  Non-ACS:    Anginal Classification: CCS III  Anti-ischemic medical therapy: Maximal Therapy (2 or more classes of medications)  Non-Invasive Test Results: High-risk stress test findings: cardiac mortality >3%/year  Prior CABG: No previous CABG        Maria Myers

## 2018-01-08 ENCOUNTER — Encounter (HOSPITAL_COMMUNITY): Payer: Self-pay | Admitting: Cardiology

## 2018-01-08 DIAGNOSIS — I2582 Chronic total occlusion of coronary artery: Secondary | ICD-10-CM | POA: Diagnosis not present

## 2018-01-08 DIAGNOSIS — Z955 Presence of coronary angioplasty implant and graft: Secondary | ICD-10-CM

## 2018-01-08 DIAGNOSIS — I25110 Atherosclerotic heart disease of native coronary artery with unstable angina pectoris: Secondary | ICD-10-CM | POA: Diagnosis not present

## 2018-01-08 DIAGNOSIS — E079 Disorder of thyroid, unspecified: Secondary | ICD-10-CM | POA: Diagnosis not present

## 2018-01-08 DIAGNOSIS — R9439 Abnormal result of other cardiovascular function study: Secondary | ICD-10-CM | POA: Diagnosis not present

## 2018-01-08 LAB — BASIC METABOLIC PANEL
Anion gap: 8 (ref 5–15)
BUN: 10 mg/dL (ref 6–20)
CALCIUM: 8.8 mg/dL — AB (ref 8.9–10.3)
CO2: 26 mmol/L (ref 22–32)
CREATININE: 0.87 mg/dL (ref 0.44–1.00)
Chloride: 104 mmol/L (ref 98–111)
Glucose, Bld: 96 mg/dL (ref 70–99)
Potassium: 3.7 mmol/L (ref 3.5–5.1)
SODIUM: 138 mmol/L (ref 135–145)

## 2018-01-08 LAB — CBC
HCT: 38.8 % (ref 36.0–46.0)
Hemoglobin: 12.6 g/dL (ref 12.0–15.0)
MCH: 27.8 pg (ref 26.0–34.0)
MCHC: 32.5 g/dL (ref 30.0–36.0)
MCV: 85.7 fL (ref 78.0–100.0)
PLATELETS: 184 10*3/uL (ref 150–400)
RBC: 4.53 MIL/uL (ref 3.87–5.11)
RDW: 12.8 % (ref 11.5–15.5)
WBC: 6.4 10*3/uL (ref 4.0–10.5)

## 2018-01-08 MED ORDER — TICAGRELOR 90 MG PO TABS: 90.0000 mg | ORAL_TABLET | Freq: Two times a day (BID) | ORAL | 1 refills | Status: DC

## 2018-01-08 NOTE — Care Management (Signed)
01-07-18  BENEFITS CHECK: S/W  RAY  @ [facility] #  [phone]   BRILINTA  90 MG BID COVER- YES  AND NONE PREFERRED  CO-PAY- $ 222.00 TIER- 3 DRUG PRIOR APPROVAL -NO NO DEDUCTIBLE   ALTERNATIVE: 1.CLOPIDOGREL 75 MG BID COVER- YES CO-PAY- $ 6.69 TIER- 1 DRUG PRIOR APPROVAL- NO  PREFERRED PHARMACY : YES   [facility]

## 2018-01-08 NOTE — Progress Notes (Signed)
CARDIAC REHAB PHASE I   PRE:  Rate/Rhythm: 71 SR  BP:  Sitting: 102/44      SaO2: 97 RA  MODE:  Ambulation: 250 ft   POST:  Rate/Rhythm: 58 SB  BP:  Sitting: 116/73    SaO2: 98 RA   Pt ambulated 26150ft in hallway, hand held assist with slow steady gait. Pt states she is "uncomfortable" to go further. Pt encouraged to walk more before d/c to ensure she is not having CP with activity. Pt and husband educated on importance of ASA, Brilinta, statin and NTG. Stent card at bedside. Pt given heart healthy diet. Reviewed restrictions and exercise guidelines. Will send referral to [facility] [place].  [id number] Maria Myers  Maria Willenbring, RN BSN 01/08/2018 8:53 AM

## 2018-01-08 NOTE — Discharge Summary (Signed)
Discharge Summary    Patient ID: Maria Myers,  MRN: [id number], DOB/AGE: 52/29/1967 52 y.o.  Admit date: 01/07/2018 Discharge date: 01/08/2018  Primary Care Provider: Mliss Sax Primary Cardiologist: Dr. Bing Matter  Discharge Diagnoses    Active Problems:   Coronary artery disease involving native coronary artery of native heart with unstable angina pectoris [facility])   Unstable angina (HCC)   Allergies No Known Allergies  Diagnostic Studies/Procedures    Cath: 01/07/18   Prox LAD to Mid LAD lesion is 100% stenosed.  A drug-eluting stent was successfully placed using a STENT ORSIRO [id number].  Post intervention, there is a 0% residual stenosis.  The left ventricular systolic function is normal.  LV end diastolic pressure is normal.  The left ventricular ejection fraction is 50-55% by visual estimate.  There is no mitral valve regurgitation.   1. Chronic total occlusion of the mid LAD. The mid and distal vessel fills faintly from left to left and right to left collaterals.  2. No disease noted in the Circumflex or RCA 3. Successful PTCA/DES x 1 mid LAD 4. Mild segmental LV systolic dysfunction  Recommendations: Will watch overnight. ASA, Brilinta, statin and beta blocker.   Recommend uninterrupted dual antiplatelet therapy with Aspirin 81mg  daily and Ticagrelor 90mg  twice daily for a minimum of 12 months (ACS - Class I recommendation). _____________   History of Present Illness     52 y.o. female with no significant past medical history who for last few weeks she experienced exertional tightness in the chest.  She reported going to gym on the regular basis, and when she uses on elliptical treadmill, she has to slow down and stop because of tightness in the chest. She eventually ended up having stress test which showed ischemia in LAD territory.  She did very poorly on the treadmill but only more than 3 minutes.  She was given nitroglycerin as needed and she was  referred to cardiology.  Again she does not have any past medical history there is no hypertension no diabetes no family history of premature coronary artery disease. Overall it was a surprising finding that she would get fairly typical exertional angina pectoris with LAD ischemia on stress test. Given her symptoms and abnormal stress test, she was referred for outpatient cardiac cath.   Hospital Course     Underwent cath noted above with CTO of the LAD with collaterals. Successful PCI/DES to the mLAD x1. No residual disease noted. EF noted at 50-55% on LV gram. No complications noted overnight. She was placed on statin, and BB prior to admission. Will continue the same. No recurrent chest pain while working with cardiac rehab. She will be given a 30 day for her Brilinta. Of note her copay appears to be around $200, therefore may need to convert to plavix after 30 days. Have instructed her to follow up with the office if her medication supply gets low as to not run out before her appt.   General: Well developed, well nourished, female appearing in no acute distress. Head: Normocephalic, atraumatic.  Neck: Supple without bruits, JVD. Lungs:  Resp regular and unlabored, CTA. Heart: RRR, S1, S2, no murmur; no rub. Abdomen: Soft, non-tender, non-distended with normoactive bowel sounds.  Extremities: No clubbing, cyanosis, edema. Distal pedal pulses are 2+ bilaterally. R radial cath site stable without bruising or hematoma Neuro: Alert and oriented X 3. Moves all extremities spontaneously. Psych: Normal affect.  Maria Myers was seen by Dr. [place] and determined stable  for discharge home. Follow up in the office has been arranged. Medications are listed below.   _____________  Discharge Vitals Blood pressure (!) 109/58, pulse (!) 56, temperature 98.4 F (36.9 C), temperature source Oral, resp. rate 18, height 5\' 2"  (1.575 m), weight 160 lb 4.4 oz (72.7 kg), SpO2 99 %.  Filed Weights   01/07/18 0904  01/08/18 0608  Weight: 159 lb (72.1 kg) 160 lb 4.4 oz (72.7 kg)    Labs & Radiologic Studies    CBC Recent Labs    01/08/18 0233  WBC 6.4  HGB 12.6  HCT 38.8  MCV 85.7  PLT 184   Basic Metabolic Panel Recent Labs    13/08/65 0233  NA 138  K 3.7  CL 104  CO2 26  GLUCOSE 96  BUN 10  CREATININE 0.87  CALCIUM 8.8*   Liver Function Tests No results for input(s): AST, ALT, ALKPHOS, BILITOT, PROT, ALBUMIN in the last 72 hours. No results for input(s): LIPASE, AMYLASE in the last 72 hours. Cardiac Enzymes No results for input(s): CKTOTAL, CKMB, CKMBINDEX, TROPONINI in the last 72 hours. BNP Invalid input(s): POCBNP D-Dimer No results for input(s): DDIMER in the last 72 hours. Hemoglobin A1C No results for input(s): HGBA1C in the last 72 hours. Fasting Lipid Panel No results for input(s): CHOL, HDL, LDLCALC, TRIG, CHOLHDL, LDLDIRECT in the last 72 hours. Thyroid Function Tests No results for input(s): TSH, T4TOTAL, T3FREE, THYROIDAB in the last 72 hours.  Invalid input(s): FREET3 _____________  No results found. Disposition   Pt is being discharged home today in good condition.  Follow-up Plans & Appointments    Follow-up Information    Georgeanna Lea, MD Follow up on 02/01/2018.   Specialty:  Cardiology Why:  at 11:20am for your follow up appt.  Contact information: [address] [place] [address] [phone]          Discharge Instructions    Amb Referral to Cardiac Rehabilitation   Complete by:  As directed    Diagnosis:  Coronary Stents   Call MD for:  redness, tenderness, or signs of infection (pain, swelling, redness, odor or green/yellow discharge around incision site)   Complete by:  As directed    Diet - low sodium heart healthy   Complete by:  As directed    Discharge instructions   Complete by:  As directed    Radial Site Care Refer to this sheet in the next few weeks. These instructions provide you with information on  caring for yourself after your procedure. Your caregiver may also give you more specific instructions. Your treatment has been planned according to current medical practices, but problems sometimes occur. Call your caregiver if you have any problems or questions after your procedure. HOME CARE INSTRUCTIONS You may shower the day after the procedure.Remove the bandage (dressing) and gently wash the site with plain soap and water.Gently pat the site dry.  Do not apply powder or lotion to the site.  Do not submerge the affected site in water for 3 to 5 days.  Inspect the site at least twice daily.  Do not flex or bend the affected arm for 24 hours.  No lifting over 5 pounds (2.3 kg) for 5 days after your procedure.  Do not drive home if you are discharged the same day of the procedure. Have someone else drive you.  You may drive 24 hours after the procedure unless otherwise instructed by your caregiver.  What to expect:  Any bruising will usually fade within 1 to 2 weeks.  Blood that collects in the tissue (hematoma) may be painful to the touch. It should usually decrease in size and tenderness within 1 to 2 weeks.  SEEK IMMEDIATE MEDICAL CARE IF: You have unusual pain at the radial site.  You have redness, warmth, swelling, or pain at the radial site.  You have drainage (other than a small amount of blood on the dressing).  You have chills.  You have a fever or persistent symptoms for more than 72 hours.  You have a fever and your symptoms suddenly get worse.  Your arm becomes pale, cool, tingly, or numb.  You have heavy bleeding from the site. Hold pressure on the site.   PLEASE DO NOT MISS ANY DOSES OF YOUR BRILINTA!!!!! Also keep a log of you blood pressures and bring back to your follow up appt. Please call the office with any questions.   Patients taking blood thinners should generally stay away from medicines like ibuprofen, Advil, Motrin, naproxen, and Aleve due to risk of stomach  bleeding. You may take Tylenol as directed or talk to your primary doctor about alternatives.   Increase activity slowly   Complete by:  As directed        Discharge Medications     Medication List    STOP taking these medications   PARoxetine 12.5 MG 24 hr tablet Commonly known as:  PAXIL-CR     TAKE these medications   albuterol 108 (90 Base) MCG/ACT inhaler Commonly known as:  PROVENTIL HFA;VENTOLIN HFA Inhale 2 puffs into the lungs every 4 (four) hours as needed for wheezing or shortness of breath.   aspirin EC 81 MG tablet Take 1 tablet (81 mg total) by mouth daily.   atorvastatin 40 MG tablet Commonly known as:  LIPITOR Take 1 tablet (40 mg total) by mouth daily at 6 PM.   calcium-vitamin D 500-200 MG-UNIT tablet Commonly known as:  OSCAL WITH D Take 1 tablet by mouth 2 (two) times daily.   fluticasone 50 MCG/ACT nasal spray Commonly known as:  FLONASE Place 2 sprays into both nostrils daily.   levothyroxine 75 MCG tablet Commonly known as:  SYNTHROID, LEVOTHROID Take 1 tablet (75 mcg total) by mouth daily before breakfast.   metoprolol tartrate 25 MG tablet Commonly known as:  LOPRESSOR Take 0.5 tablets (12.5 mg total) by mouth 2 (two) times daily.   multivitamin with minerals Tabs tablet Take 1 tablet by mouth 4 (four) times a week.   nitroGLYCERIN 0.4 MG SL tablet Commonly known as:  NITROSTAT Place 0.4 mg under the tongue every 5 (five) minutes as needed for chest pain.   omeprazole 40 MG capsule Commonly known as:  PRILOSEC Take 40 mg by mouth daily before supper.   ranolazine 500 MG 12 hr tablet Commonly known as:  RANEXA Take 1 tablet (500 mg total) by mouth 2 (two) times daily.   ticagrelor 90 MG Tabs tablet Commonly known as:  BRILINTA Take 1 tablet (90 mg total) by mouth 2 (two) times daily.   Vitamin D (Ergocalciferol) 50000 units Caps capsule Commonly known as:  DRISDOL Take 1 capsule (50,000 Units total) by mouth every 7 (seven)  days. What changed:  when to take this       Acute coronary syndrome (MI, NSTEMI, STEMI, etc) this admission?: No.     Outstanding Labs/Studies   FLP/LFTs in 6 weeks.   Duration of Discharge Encounter   Greater than  30 minutes including physician time.  Signed, Laverda Page NP-C 01/08/2018, 9:16 AM

## 2018-01-09 ENCOUNTER — Telehealth (HOSPITAL_COMMUNITY): Payer: Self-pay

## 2018-01-09 MED FILL — Nitroglycerin IV Soln 100 MCG/ML in D5W: INTRA_ARTERIAL | Qty: 10 | Status: AC

## 2018-01-09 NOTE — Telephone Encounter (Signed)
Called and spoke with patient in regards to Cardiac Rehab - Patient stated she has not had a moment to go over the brochure that was given in the hospital. She asked that I call in a couple of days. Will follow up next week.

## 2018-01-09 NOTE — Telephone Encounter (Signed)
Patients insurance is active and benefits verified through [facility] - $30.00 co-pay, no deductible, out of pocket amount of $5,500/$748.09 has been met, no co-insurance, and no pre-authorization is required. Passport/reference [id number]

## 2018-01-10 LAB — BASIC METABOLIC PANEL

## 2018-01-10 LAB — CBC
HEMATOCRIT: 41.2 % (ref 34.0–46.6)
HEMOGLOBIN: 13.7 g/dL (ref 11.1–15.9)
MCH: 27.4 pg (ref 26.6–33.0)
MCHC: 33.3 g/dL (ref 31.5–35.7)
MCV: 82 fL (ref 79–97)
Platelets: 240 10*3/uL (ref 150–450)
RBC: 5 x10E6/uL (ref 3.77–5.28)
RDW: 13.2 % (ref 12.3–15.4)
WBC: 6.4 10*3/uL (ref 3.4–10.8)

## 2018-01-15 ENCOUNTER — Encounter: Payer: Self-pay | Admitting: Emergency Medicine

## 2018-01-15 ENCOUNTER — Encounter: Payer: Self-pay | Admitting: Cardiology

## 2018-01-15 ENCOUNTER — Ambulatory Visit: Payer: Federal, State, Local not specified - PPO | Admitting: Cardiology

## 2018-01-15 VITALS — BP 116/68 | HR 55 | Ht 62.0 in | Wt 161.0 lb

## 2018-01-15 DIAGNOSIS — F419 Anxiety disorder, unspecified: Secondary | ICD-10-CM | POA: Diagnosis not present

## 2018-01-15 DIAGNOSIS — Z955 Presence of coronary angioplasty implant and graft: Secondary | ICD-10-CM | POA: Diagnosis not present

## 2018-01-15 DIAGNOSIS — R0789 Other chest pain: Secondary | ICD-10-CM

## 2018-01-15 DIAGNOSIS — I2510 Atherosclerotic heart disease of native coronary artery without angina pectoris: Secondary | ICD-10-CM

## 2018-01-15 NOTE — Patient Instructions (Addendum)
Your physician has recommended you make the following change in your medication:   STOP: Ranolazine    Labwork: None.  Testing/Procedures: None.  Follow-Up: Your physician recommends that you schedule a follow-up appointment in: 1 month.    Any Other Special Instructions Will Be Listed Below (If Applicable).     If you need a refill on your cardiac medications before your next appointment, please call your pharmacy.  .Marland Kitchen

## 2018-01-15 NOTE — Progress Notes (Signed)
Cardiology Office Note:    Date:  01/15/2018   ID:  Maria Myers, DOB 07/03/1965, MRN [id number]  PCP:  Maria Myers, Maria Alfred, MD  Cardiologist:  Maria Balsamobert Krasowski, MD    Referring MD: Maria Myers, Maria Myers,*   No chief complaint on file. Follow-up after hospitalization  History of Present Illness:    Maria Evelene CroonKaur is a 52 y.o. female who presented to me with abnormal stress test as well as typical angina pectoris cardiac catheterization was done she had completely occluded mid LAD which was addressed with drug-eluting stent.  She is doing much better after that but she is scared of doing anything because she worries so much she will have some another problem.  With talking length about what was done which was fixed I told her from my point of view she can do whatever she wants to do but because of her right radial approach and some swelling over there I asked her to protect her right arm for another week.  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  Denies have any chest pain tightness squeezing pressure burning chest but afraid to go outside and afraid to do anything.  Past Medical History:  Diagnosis Date  . Allergy   . Coronary artery disease    01/07/18 PCI/DES x1, normal EF via LV gram.   . Thyroid disease     Past Surgical History:  Procedure Laterality Date  . CESAREAN SECTION    . CORONARY STENT INTERVENTION N/A 01/07/2018   Procedure: CORONARY STENT INTERVENTION;  Surgeon: Kathleene HazelMcAlhany, Christopher D, MD;  [facility]: [facility];  Service: Cardiovascular;  Laterality: N/A;  . LEFT HEART CATH AND CORONARY ANGIOGRAPHY N/A 01/07/2018   Procedure: LEFT HEART CATH AND CORONARY ANGIOGRAPHY;  Surgeon: Kathleene HazelMcAlhany, Christopher D, MD;  Location: [facility];  Service: Cardiovascular;  Laterality: N/A;    Current Medications: Current Meds  Medication Sig  . albuterol (PROVENTIL HFA;VENTOLIN HFA) 108  (90 Base) MCG/ACT inhaler Inhale 2 puffs into the lungs every 4 (four) hours as needed for wheezing or shortness of breath.  Marland Kitchen. aspirin EC 81 MG tablet Take 1 tablet (81 mg total) by mouth daily.  Marland Kitchen. atorvastatin (LIPITOR) 40 MG tablet Take 1 tablet (40 mg total) by mouth daily at 6 PM.  . calcium-vitamin D (OSCAL WITH D) 500-200 MG-UNIT tablet Take 1 tablet by mouth 2 (two) times daily.  . fluticasone (FLONASE) 50 MCG/ACT nasal spray Place 2 sprays into both nostrils daily.  Marland Kitchen. levothyroxine (SYNTHROID, LEVOTHROID) 75 MCG tablet Take 1 tablet (75 mcg total) by mouth daily before breakfast.  . metoprolol tartrate (LOPRESSOR) 25 MG tablet Take 0.5 tablets (12.5 mg total) by mouth 2 (two) times daily.  . Multiple Vitamin (MULTIVITAMIN WITH MINERALS) TABS tablet Take 1 tablet by mouth 4 (four) times a week.   . nitroGLYCERIN (NITROSTAT) 0.4 MG SL tablet Place 0.4 mg under the tongue every 5 (five) minutes as needed for chest pain.  Marland Kitchen. omeprazole (PRILOSEC) 40 MG capsule Take 40 mg by mouth daily before supper.  . ranolazine (RANEXA) 500 MG 12 hr tablet Take 1 tablet (500 mg total) by mouth 2 (two) times daily.  . ticagrelor (BRILINTA) 90 MG TABS tablet Take 1 tablet (90 mg total) by mouth 2 (two) times daily.  . Vitamin D, Ergocalciferol, (DRISDOL) 50000 units CAPS capsule Take 1 capsule (50,000 Units total) by mouth every  7 (seven) days. (Patient taking differently: Take 50,000 Units by mouth every Sunday. )     Allergies:   Patient has no known allergies.   Social History   Socioeconomic History  . Marital status: Married    Spouse name: Not on file  . Number of children: Not on file  . Years of education: Not on file  . Highest education level: Not on file  Occupational History  . Not on file  Social Needs  . Financial resource strain: Not on file  . Food insecurity:    Worry: Not on file    Inability: Not on file  . Transportation needs:    Medical: Not on file    Non-medical: Not on  file  Tobacco Use  . Smoking status: Never Smoker  . Smokeless tobacco: Never Used  Substance and Sexual Activity  . Alcohol use: Never    Frequency: Never  . Drug use: No  . Sexual activity: Not on file  Lifestyle  . Physical activity:    Days per week: Not on file    Minutes per session: Not on file  . Stress: Not on file  Relationships  . Social connections:    Talks on phone: Not on file    Gets together: Not on file    Attends religious service: Not on file    Active member of club or organization: Not on file    Attends meetings of clubs or organizations: Not on file    Relationship status: Not on file  Other Topics Concern  . Not on file  Social History Narrative  . Not on file     Family History: The patient's family history includes Arthritis in her sister. ROS:   Please see the history of present illness.    All 14 point review of systems negative except as described per history of present illness  EKGs/Labs/Other Studies Reviewed:      Recent Labs: 08/01/2017: ALT 20 10/16/2017: TSH 1.04 01/08/2018: BUN 10; Creatinine, Ser 0.87; Hemoglobin 12.6; Platelets 184; Potassium 3.7; Sodium 138  Recent Lipid Panel    Component Value Date/Time   CHOL 169 08/01/2017 1210   TRIG 147.0 08/01/2017 1210   HDL 62.30 08/01/2017 1210   CHOLHDL 3 08/01/2017 1210   VLDL 29.4 08/01/2017 1210   LDLCALC 77 08/01/2017 1210    Physical Exam:    VS:  BP 116/68 (BP Location: Right Arm, Patient Position: Sitting, Cuff Size: Normal)   Pulse (!) 55   Ht 5\' 2"  (1.575 m)   Wt 161 lb (73 kg)   SpO2 98%   BMI 29.45 kg/m     Wt Readings from Last 3 Encounters:  01/15/18 161 lb (73 kg)  01/08/18 160 lb 4.4 oz (72.7 kg)  01/04/18 159 lb (72.1 kg)     GEN:  Well nourished, well developed in no acute distress HEENT: Normal NECK: No JVD; No carotid bruits LYMPHATICS: No lymphadenopathy CARDIAC: RRR, no murmurs, no rubs, no gallops RESPIRATORY:  Clear to auscultation without  rales, wheezing or rhonchi  ABDOMEN: Soft, non-tender, non-distended MUSCULOSKELETAL:  No edema; No deformity  SKIN: Warm and dry LOWER EXTREMITIES: no swelling NEUROLOGIC:  Alert and oriented x 3 PSYCHIATRIC:  Normal affect   ASSESSMENT:    1. Status post coronary artery stent placement   2. Coronary artery disease involving native coronary artery of native heart without angina pectoris   3. Chest pressure   4. Anxiety    PLAN:  In order of problems listed above:  1. Status post coronary artery stenting mid LAD.  Clinically seems to be doing well but I spent Gradle of time explained to her it is okay for her to exercise and start working and start doing things.  She is still need to recover from the access point of view she will require to stay away from work for at least another week.  We discussed all medications I will continue present medications.  She actually is terrified by the fact that he she has to take so many medications. 2. Diminished left ventricular ejection fraction with some very of hypokinesis.  Ideally she need to be on ACE inhibitor however she does not want to take anymore medications we will continue with the present medications and we will revisit this issue next time when I see her I will stop her ranolazine. 3. I stressed importance of taking Brilinta and aspirin on a regular basis we will continue with statin in the next I will do her fasting lipid profile.   Medication Adjustments/Labs and Tests Ordered: Current medicines are reviewed at length with the patient today.  Concerns regarding medicines are outlined above.  No orders of the defined types were placed in this encounter.  Medication changes: No orders of the defined types were placed in this encounter.   Signed, Georgeanna Lea, MD, [facility] 01/15/2018 4:07 PM    [facility]

## 2018-01-16 ENCOUNTER — Telehealth (HOSPITAL_COMMUNITY): Payer: Self-pay

## 2018-01-16 NOTE — Telephone Encounter (Signed)
Called patient regarding CR, pt stated she will not be able to afford the program.  Closed ref.

## 2018-01-18 ENCOUNTER — Telehealth: Payer: Self-pay | Admitting: Emergency Medicine

## 2018-01-18 NOTE — Telephone Encounter (Signed)
Per. Dr. Vanetta ShawlKrasowski's request called to check on patient and see how she is doing. Patient reports becoming more active during the day and starting to complete normal tasks as she did before, a little more each day. Patient encouraged to continue this and follow up as scheduled. Patient verbally understands.

## 2018-01-23 ENCOUNTER — Telehealth: Payer: Self-pay | Admitting: Cardiology

## 2018-01-23 NOTE — Telephone Encounter (Signed)
This is a K patient who was recently put on brilinta post stent placement. Patient complains of painful bruising to her feet and back of legs. Per the patient she has not had any trauma to the area that would explain the bruising. Not sure what to make of this? Please advise?

## 2018-01-23 NOTE — Telephone Encounter (Signed)
Needs tobe seen either nurse or Dr KKirtland Bouchard

## 2018-01-23 NOTE — Telephone Encounter (Signed)
Patient called here to inform doctor that she is having painful bruising. She wants a phone call regarding this please.

## 2018-01-23 NOTE — Telephone Encounter (Signed)
Patient will come for nurse visit tomorrow to visualize bruising.

## 2018-01-24 ENCOUNTER — Ambulatory Visit (INDEPENDENT_AMBULATORY_CARE_PROVIDER_SITE_OTHER): Payer: Federal, State, Local not specified - PPO | Admitting: Cardiology

## 2018-01-24 DIAGNOSIS — I2510 Atherosclerotic heart disease of native coronary artery without angina pectoris: Secondary | ICD-10-CM

## 2018-01-24 NOTE — Progress Notes (Signed)
Patient came into the office this afternoon to have her bruising evaluated. I had Dr. Tomie Chinaevankar look at the bruising. She had stated that some of the spots were bruising was painful and that she had not done anything that she could recall that had cause it. Dr. Tomie Chinaevankar further discussed that at this point that changing her Brilinta was not necessary and if she continued to have issues that she would need to further discuss it with Dr. Bing MatterKrasowski at her next follow up.

## 2018-01-25 NOTE — Telephone Encounter (Signed)
Patient was seen yesterday in [facility] for nurse visit. See Nurse Visit

## 2018-02-01 ENCOUNTER — Ambulatory Visit: Payer: Self-pay | Admitting: Cardiology

## 2018-02-05 ENCOUNTER — Other Ambulatory Visit: Payer: Self-pay | Admitting: Cardiology

## 2018-02-05 MED ORDER — TICAGRELOR 90 MG PO TABS: 90.0000 mg | ORAL_TABLET | Freq: Two times a day (BID) | ORAL | 2 refills | Status: DC

## 2018-02-05 NOTE — Telephone Encounter (Signed)
Refill for brilinta sent to [facility] in [place] as requested.

## 2018-02-05 NOTE — Telephone Encounter (Signed)
°  Patient only has two tablets left.   1. Which medications need to be refilled? (please list name of each medication and dose if known) ticagrelor 90mg  tabs  2. Which pharmacy/location (including street and city if local pharmacy) is medication to be sent to?[facility]  3. Do they need a 30 day or 90 day supply? 90 day supply

## 2018-02-05 NOTE — Addendum Note (Signed)
Addended by: Crist Fat on: 02/05/2018 09:33 AM   Modules accepted: Orders

## 2018-02-25 ENCOUNTER — Ambulatory Visit: Payer: Federal, State, Local not specified - PPO | Admitting: Cardiology

## 2018-02-25 ENCOUNTER — Encounter: Payer: Self-pay | Admitting: Cardiology

## 2018-02-25 VITALS — BP 110/62 | HR 57 | Ht 62.0 in | Wt 160.0 lb

## 2018-02-25 DIAGNOSIS — I2510 Atherosclerotic heart disease of native coronary artery without angina pectoris: Secondary | ICD-10-CM | POA: Diagnosis not present

## 2018-02-25 DIAGNOSIS — F419 Anxiety disorder, unspecified: Secondary | ICD-10-CM

## 2018-02-25 DIAGNOSIS — Z955 Presence of coronary angioplasty implant and graft: Secondary | ICD-10-CM

## 2018-02-25 NOTE — Patient Instructions (Signed)
Medication Instructions:  Your physician recommends that you continue on your current medications as directed. Please refer to the Current Medication list given to you today.    Labwork: Your physician recommends that you return for lab work today: Lipids  Testing/Procedures: None.  Follow-Up: Your physician wants you to follow-up in: 4 months.  You will receive a reminder letter in the mail two months in advance. If you don't receive a letter, please call our office to schedule the follow-up appointment.   Any Other Special Instructions Will Be Listed Below (If Applicable).     If you need a refill on your cardiac medications before your next appointment, please call your pharmacy.

## 2018-02-25 NOTE — Progress Notes (Signed)
Cardiology Office Note:    Date:  02/25/2018   ID:  Maria SkeansAvtar Heppler, DOB 11/24/1965, MRN [id number]  PCP:  Mliss SaxKremer, William Alfred, MD  Cardiologist:  Gypsy Balsamobert Ivan Maskell, MD    Referring MD: Mliss SaxKremer, William Alfred,*   Chief Complaint  Patient presents with  . 1 month follow up  Doing well denies having any chest pain  History of Present Illness:    Maria Myers is a 52 y.o. female coronary artery disease status post PTCA and stenting of mid left anterior descending artery which was completely occluded she is doing very well she is back at work she worked hard to have no difficulty doing it no chest pain tightness squeezing pressure burning chest while doing it.  She is always very anxious person and we spent Gradle of time talking today about medications need of taking those medications she is obsessively taking medication with very corrected precise time which is very good and I encouraged her to keep doing it.  Past Medical History:  Diagnosis Date  . Allergy   . Coronary artery disease    01/07/18 PCI/DES x1, normal EF via LV gram.   . Thyroid disease     Past Surgical History:  Procedure Laterality Date  . CESAREAN SECTION    . CORONARY STENT INTERVENTION N/A 01/07/2018   Procedure: CORONARY STENT INTERVENTION;  Surgeon: Kathleene HazelMcAlhany, Christopher D, MD;  Location: [facility];  Service: Cardiovascular;  Laterality: N/A;  . LEFT HEART CATH AND CORONARY ANGIOGRAPHY N/A 01/07/2018   Procedure: LEFT HEART CATH AND CORONARY ANGIOGRAPHY;  Surgeon: Kathleene HazelMcAlhany, Christopher D, MD;  Location: [facility];  Service: Cardiovascular;  Laterality: N/A;    Current Medications: Current Meds  Medication Sig  . albuterol (PROVENTIL HFA;VENTOLIN HFA) 108 (90 Base) MCG/ACT inhaler Inhale 2 puffs into the lungs every 4 (four) hours as needed for wheezing or shortness of breath.  Marland Kitchen. aspirin EC 81 MG tablet Take 1 tablet (81 mg total) by mouth daily.  Marland Kitchen. atorvastatin (LIPITOR) 40 MG tablet Take 1 tablet  (40 mg total) by mouth daily at 6 PM.  . calcium-vitamin D (OSCAL WITH D) 500-200 MG-UNIT tablet Take 1 tablet by mouth 2 (two) times daily.  . fluticasone (FLONASE) 50 MCG/ACT nasal spray Place 2 sprays into both nostrils daily.  Marland Kitchen. levothyroxine (SYNTHROID, LEVOTHROID) 75 MCG tablet Take 1 tablet (75 mcg total) by mouth daily before breakfast.  . metoprolol tartrate (LOPRESSOR) 25 MG tablet Take 0.5 tablets (12.5 mg total) by mouth 2 (two) times daily.  . Multiple Vitamin (MULTIVITAMIN WITH MINERALS) TABS tablet Take 1 tablet by mouth 4 (four) times a week.   . nitroGLYCERIN (NITROSTAT) 0.4 MG SL tablet Place 0.4 mg under the tongue every 5 (five) minutes as needed for chest pain.  Marland Kitchen. omeprazole (PRILOSEC) 40 MG capsule Take 40 mg by mouth daily before supper.  . ticagrelor (BRILINTA) 90 MG TABS tablet Take 1 tablet (90 mg total) by mouth 2 (two) times daily.  . Vitamin D, Ergocalciferol, (DRISDOL) 50000 units CAPS capsule Take 1 capsule (50,000 Units total) by mouth every 7 (seven) days. (Patient taking differently: Take 50,000 Units by mouth every Sunday. )     Allergies:   Patient has no known allergies.   Social History   Socioeconomic History  . Marital status: Married    Spouse name: Not on file  . Number of children: Not on file  . Years of education: Not on file  . Highest education level: Not  on file  Occupational History  . Not on file  Social Needs  . Financial resource strain: Not on file  . Food insecurity:    Worry: Not on file    Inability: Not on file  . Transportation needs:    Medical: Not on file    Non-medical: Not on file  Tobacco Use  . Smoking status: Never Smoker  . Smokeless tobacco: Never Used  Substance and Sexual Activity  . Alcohol use: Never    Frequency: Never  . Drug use: No  . Sexual activity: Not on file  Lifestyle  . Physical activity:    Days per week: Not on file    Minutes per session: Not on file  . Stress: Not on file  Relationships   . Social connections:    Talks on phone: Not on file    Gets together: Not on file    Attends religious service: Not on file    Active member of club or organization: Not on file    Attends meetings of clubs or organizations: Not on file    Relationship status: Not on file  Other Topics Concern  . Not on file  Social History Narrative  . Not on file     Family History: The patient's family history includes Arthritis in her sister. ROS:   Please see the history of present illness.    All 14 point review of systems negative except as described per history of present illness  EKGs/Labs/Other Studies Reviewed:      Recent Labs: 08/01/2017: ALT 20 10/16/2017: TSH 1.04 01/08/2018: BUN 10; Creatinine, Ser 0.87; Hemoglobin 12.6; Platelets 184; Potassium 3.7; Sodium 138  Recent Lipid Panel    Component Value Date/Time   CHOL 169 08/01/2017 1210   TRIG 147.0 08/01/2017 1210   HDL 62.30 08/01/2017 1210   CHOLHDL 3 08/01/2017 1210   VLDL 29.4 08/01/2017 1210   LDLCALC 77 08/01/2017 1210    Physical Exam:    VS:  BP 110/62   Pulse (!) 57   Ht 5\' 2"  (1.575 m)   Wt 160 lb (72.6 kg)   SpO2 98%   BMI 29.26 kg/m     Wt Readings from Last 3 Encounters:  02/25/18 160 lb (72.6 kg)  01/15/18 161 lb (73 kg)  01/08/18 160 lb 4.4 oz (72.7 kg)     GEN:  Well nourished, well developed in no acute distress HEENT: Normal NECK: No JVD; No carotid bruits LYMPHATICS: No lymphadenopathy CARDIAC: RRR, no murmurs, no rubs, no gallops RESPIRATORY:  Clear to auscultation without rales, wheezing or rhonchi  ABDOMEN: Soft, non-tender, non-distended MUSCULOSKELETAL:  No edema; No deformity  SKIN: Warm and dry LOWER EXTREMITIES: no swelling NEUROLOGIC:  Alert and oriented x 3 PSYCHIATRIC:  Normal affect   ASSESSMENT:    1. Coronary artery disease involving native coronary artery of native heart without angina pectoris   2. Status post coronary artery stent placement   3. Anxiety     PLAN:    In order of problems listed above:  1. Coronary artery disease doing well from that point of view I stressed the importance of taking dual antiplatelet agent for at least one year.  Will review also need for beta-blocker as well as statin. 2. Dyslipidemia I will check her cholesterol level today. 3. Anxiety: We spent Gradle of time talking about her issues she seems to be doing well overall.   Medication Adjustments/Labs and Tests Ordered: Current medicines are reviewed at length  with the patient today.  Concerns regarding medicines are outlined above.  No orders of the defined types were placed in this encounter.  Medication changes: No orders of the defined types were placed in this encounter.   Signed, Georgeanna Lea, MD, [facility] 02/25/2018 4:32 PM     [facility]

## 2018-02-26 LAB — LIPID PANEL
CHOL/HDL RATIO: 2.3 ratio (ref 0.0–4.4)
Cholesterol, Total: 120 mg/dL (ref 100–199)
HDL: 52 mg/dL (ref >39)
LDL Calculated: 47 mg/dL (ref 0–99)
TRIGLYCERIDES: 104 mg/dL (ref 0–149)
VLDL CHOLESTEROL CAL: 21 mg/dL (ref 5–40)

## 2018-03-04 ENCOUNTER — Ambulatory Visit: Payer: Self-pay | Admitting: Cardiology

## 2018-05-03 ENCOUNTER — Other Ambulatory Visit: Payer: Self-pay | Admitting: Cardiology

## 2018-05-03 DIAGNOSIS — I209 Angina pectoris, unspecified: Secondary | ICD-10-CM

## 2018-05-06 ENCOUNTER — Other Ambulatory Visit: Payer: Self-pay | Admitting: Cardiology

## 2018-05-06 ENCOUNTER — Telehealth: Payer: Self-pay | Admitting: Cardiology

## 2018-05-06 DIAGNOSIS — J309 Allergic rhinitis, unspecified: Secondary | ICD-10-CM | POA: Diagnosis not present

## 2018-05-06 DIAGNOSIS — I209 Angina pectoris, unspecified: Secondary | ICD-10-CM

## 2018-05-06 MED ORDER — ATORVASTATIN CALCIUM 40 MG PO TABS: ORAL_TABLET | ORAL | 1 refills | Status: DC

## 2018-05-06 NOTE — Telephone Encounter (Signed)
°  Patient is out of medicine   1. Which medications need to be refilled? (please list name of each medication and dose if known) atorvastatin 40mg   2. Which pharmacy/location (including street and city if local pharmacy) is medication to be sent to? [facility]   3. Do they need a 30 day or 90 day supply? 90

## 2018-05-06 NOTE — Telephone Encounter (Signed)
Atorvastatin 40 mg daily refilled.

## 2018-05-13 DIAGNOSIS — J309 Allergic rhinitis, unspecified: Secondary | ICD-10-CM | POA: Diagnosis not present

## 2018-05-20 DIAGNOSIS — J309 Allergic rhinitis, unspecified: Secondary | ICD-10-CM | POA: Diagnosis not present

## 2018-06-03 DIAGNOSIS — J309 Allergic rhinitis, unspecified: Secondary | ICD-10-CM | POA: Diagnosis not present

## 2018-06-10 DIAGNOSIS — J309 Allergic rhinitis, unspecified: Secondary | ICD-10-CM | POA: Diagnosis not present

## 2018-06-17 DIAGNOSIS — J309 Allergic rhinitis, unspecified: Secondary | ICD-10-CM | POA: Diagnosis not present

## 2018-06-24 DIAGNOSIS — J309 Allergic rhinitis, unspecified: Secondary | ICD-10-CM | POA: Diagnosis not present

## 2018-07-01 DIAGNOSIS — J309 Allergic rhinitis, unspecified: Secondary | ICD-10-CM | POA: Diagnosis not present

## 2018-07-01 DIAGNOSIS — R5381 Other malaise: Secondary | ICD-10-CM | POA: Diagnosis not present

## 2018-07-01 DIAGNOSIS — K210 Gastro-esophageal reflux disease with esophagitis: Secondary | ICD-10-CM | POA: Diagnosis not present

## 2018-07-01 DIAGNOSIS — Z6831 Body mass index (BMI) 31.0-31.9, adult: Secondary | ICD-10-CM | POA: Diagnosis not present

## 2018-07-01 DIAGNOSIS — I10 Essential (primary) hypertension: Secondary | ICD-10-CM | POA: Diagnosis not present

## 2018-07-15 DIAGNOSIS — J309 Allergic rhinitis, unspecified: Secondary | ICD-10-CM | POA: Diagnosis not present

## 2018-07-22 DIAGNOSIS — J309 Allergic rhinitis, unspecified: Secondary | ICD-10-CM | POA: Diagnosis not present

## 2018-07-29 DIAGNOSIS — J309 Allergic rhinitis, unspecified: Secondary | ICD-10-CM | POA: Diagnosis not present

## 2018-08-05 DIAGNOSIS — J309 Allergic rhinitis, unspecified: Secondary | ICD-10-CM | POA: Diagnosis not present

## 2018-08-12 DIAGNOSIS — J309 Allergic rhinitis, unspecified: Secondary | ICD-10-CM | POA: Diagnosis not present

## 2018-08-12 DIAGNOSIS — I10 Essential (primary) hypertension: Secondary | ICD-10-CM | POA: Diagnosis not present

## 2018-08-12 DIAGNOSIS — I2510 Atherosclerotic heart disease of native coronary artery without angina pectoris: Secondary | ICD-10-CM | POA: Diagnosis not present

## 2018-08-12 DIAGNOSIS — J019 Acute sinusitis, unspecified: Secondary | ICD-10-CM | POA: Diagnosis not present

## 2018-08-12 DIAGNOSIS — Z6826 Body mass index (BMI) 26.0-26.9, adult: Secondary | ICD-10-CM | POA: Diagnosis not present

## 2018-08-23 ENCOUNTER — Other Ambulatory Visit: Payer: Self-pay | Admitting: Family Medicine

## 2018-08-23 DIAGNOSIS — E039 Hypothyroidism, unspecified: Secondary | ICD-10-CM

## 2018-08-26 ENCOUNTER — Other Ambulatory Visit: Payer: Self-pay | Admitting: Cardiology

## 2018-08-26 DIAGNOSIS — I209 Angina pectoris, unspecified: Secondary | ICD-10-CM

## 2018-08-26 MED ORDER — ATORVASTATIN CALCIUM 40 MG PO TABS: ORAL_TABLET | ORAL | 1 refills | Status: DC

## 2018-08-26 NOTE — Telephone Encounter (Signed)
Atorvastatin 40 mg daily refilled by Retia Passe, CMA

## 2018-08-26 NOTE — Telephone Encounter (Signed)
° ° ° °  1. Which medications need to be refilled? (please list name of each medication and dose if known) Atorvastatin 40mg  tablet  2. Which pharmacy/location (including street and city if local pharmacy) is medication to be sent to?[facility] on [address], [place]  3. Do they need a 30 day or 90 day supply? 90

## 2018-09-02 DIAGNOSIS — J309 Allergic rhinitis, unspecified: Secondary | ICD-10-CM | POA: Diagnosis not present

## 2018-09-09 DIAGNOSIS — J309 Allergic rhinitis, unspecified: Secondary | ICD-10-CM | POA: Diagnosis not present

## 2018-10-13 ENCOUNTER — Other Ambulatory Visit: Payer: Self-pay | Admitting: Cardiology

## 2018-10-14 NOTE — Telephone Encounter (Signed)
Rx refill sent to pharmacy. 

## 2018-10-25 ENCOUNTER — Telehealth: Payer: Self-pay | Admitting: *Deleted

## 2018-10-25 ENCOUNTER — Other Ambulatory Visit: Payer: Self-pay

## 2018-10-25 ENCOUNTER — Encounter: Payer: Self-pay | Admitting: Family Medicine

## 2018-10-25 ENCOUNTER — Ambulatory Visit (INDEPENDENT_AMBULATORY_CARE_PROVIDER_SITE_OTHER): Payer: Federal, State, Local not specified - PPO | Admitting: Family Medicine

## 2018-10-25 VITALS — Temp 98.9°F | Ht 62.0 in

## 2018-10-25 DIAGNOSIS — B349 Viral infection, unspecified: Secondary | ICD-10-CM

## 2018-10-25 DIAGNOSIS — Z20822 Contact with and (suspected) exposure to covid-19: Secondary | ICD-10-CM

## 2018-10-25 DIAGNOSIS — R6889 Other general symptoms and signs: Secondary | ICD-10-CM | POA: Diagnosis not present

## 2018-10-25 HISTORY — DX: Viral infection, unspecified: B34.9

## 2018-10-25 NOTE — Telephone Encounter (Signed)
I called pt and made her an appt to be tested for the COVID-19 at the [facility] location at 10/25/2018 at 3:45.  She was referred by Dr. Doreene Burke with the [facility] office.   Pt aware to wear a mask and stay in the car.  Insurance [place]  [id number]

## 2018-10-25 NOTE — Progress Notes (Signed)
Virtual Visit via Video Note  I connected with Maria Myers on 10/25/18 at  1:30 PM EDT by a video enabled telemedicine application and verified that I am speaking with the correct person using two identifiers.  Location: Patient: home    Established Patient Office Visit  Subjective:  Patient ID: Maria Myers, female    DOB: [birth year]  Age: 53 y.o. MRN: [id number]  CC:  Chief Complaint  Patient presents with  . Sore Throat  . Fever    HPI [name] presents for evaluation treatment of a 2-day history of fever chills stuffy nose sore throat cough and body aches.  She says that her eyes hurt.  Her bones hurt.  She denies headache.  She denies nausea or vomiting abdominal pain diarrhea but her appetite has been diminished.  She denies wheezing difficulty breathing or shortness of breath.  Her fever has responded to Tylenol.  She lives with her husband and 2 children.  They are not affected.  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  Significant past medical history of coronary artery disease.  Past Medical History:  Diagnosis Date  . Allergy   . Coronary artery disease    01/07/18 PCI/DES x1, normal EF via LV gram.   . Thyroid disease     Past Surgical History:  Procedure Laterality Date  . CESAREAN SECTION    . CORONARY STENT INTERVENTION N/A 01/07/2018   Procedure: CORONARY STENT INTERVENTION;  Surgeon: Kathleene Hazel, MD;  Location: [facility];  Service: Cardiovascular;  Laterality: N/A;  . LEFT HEART CATH AND CORONARY ANGIOGRAPHY N/A 01/07/2018   Procedure: LEFT HEART CATH AND CORONARY ANGIOGRAPHY;  Surgeon: Kathleene Hazel, MD;  Location: [facility];  Service: Cardiovascular;  Laterality: N/A;    Family History  Problem Relation Age of Onset  . Arthritis Sister     Social History   Socioeconomic History  . Marital status: Married    Spouse name: Not on file  . Number of children: Not on file  . Years of  education: Not on file  . Highest education level: Not on file  Occupational History  . Not on file  Social Needs  . Financial resource strain: Not on file  . Food insecurity:    Worry: Not on file    Inability: Not on file  . Transportation needs:    Medical: Not on file    Non-medical: Not on file  Tobacco Use  . Smoking status: Never Smoker  . Smokeless tobacco: Never Used  Substance and Sexual Activity  . Alcohol use: Never    Frequency: Never  . Drug use: No  . Sexual activity: Not on file  Lifestyle  . Physical activity:    Days per week: Not on file    Minutes per session: Not on file  . Stress: Not on file  Relationships  . Social connections:    Talks on phone: Not on file    Gets together: Not on file    Attends religious service: Not on file    Active member of club or organization: Not on file    Attends meetings of clubs or organizations: Not on file    Relationship status: Not on file  . Intimate partner violence:    Fear of current or ex partner: Not on file    Emotionally abused: Not on file    Physically abused: Not on file    Forced  sexual activity: Not on file  Other Topics Concern  . Not on file  Social History Narrative  . Not on file    Outpatient Medications Prior to Visit  Medication Sig Dispense Refill  . albuterol (PROVENTIL HFA;VENTOLIN HFA) 108 (90 Base) MCG/ACT inhaler Inhale 2 puffs into the lungs every 4 (four) hours as needed for wheezing or shortness of breath. 1 Inhaler 1  . aspirin EC 81 MG tablet Take 1 tablet (81 mg total) by mouth daily. 90 tablet 3  . atorvastatin (LIPITOR) 40 MG tablet TAKE 1 TABLET BY MOUTH EVERY DAY AT 6 PM 90 tablet 1  . calcium-vitamin D (OSCAL WITH D) 500-200 MG-UNIT tablet Take 1 tablet by mouth 2 (two) times daily. 180 tablet 5  . fluticasone (FLONASE) 50 MCG/ACT nasal spray Place 2 sprays into both nostrils daily. 16 g 6  . levothyroxine (SYNTHROID, LEVOTHROID) 75 MCG tablet TAKE 1 TABLET(75 MCG) BY  MOUTH DAILY BEFORE BREAKFAST 90 tablet 1  . metoprolol tartrate (LOPRESSOR) 25 MG tablet TAKE 1/2 TABLET(12.5 MG) BY MOUTH TWICE DAILY 90 tablet 0  . Multiple Vitamin (MULTIVITAMIN WITH MINERALS) TABS tablet Take 1 tablet by mouth 4 (four) times a week.     . nitroGLYCERIN (NITROSTAT) 0.4 MG SL tablet Place 0.4 mg under the tongue every 5 (five) minutes as needed for chest pain.    Marland Kitchen. omeprazole (PRILOSEC) 40 MG capsule Take 40 mg by mouth daily before supper.    . ticagrelor (BRILINTA) 90 MG TABS tablet Take 1 tablet (90 mg total) by mouth 2 (two) times daily. 180 tablet 2  . Vitamin D, Ergocalciferol, (DRISDOL) 50000 units CAPS capsule Take 1 capsule (50,000 Units total) by mouth every 7 (seven) days. (Patient taking differently: Take 50,000 Units by mouth every Sunday. ) 5 capsule 2   No facility-administered medications prior to visit.     No Known Allergies  ROS Review of Systems  Constitutional: Positive for appetite change, chills, fatigue and fever. Negative for diaphoresis.  HENT: Positive for congestion and sore throat. Negative for postnasal drip, rhinorrhea, trouble swallowing and voice change.   Eyes: Positive for pain. Negative for photophobia and visual disturbance.  Respiratory: Positive for cough. Negative for chest tightness, shortness of breath and wheezing.   Cardiovascular: Negative.   Gastrointestinal: Negative for abdominal pain, nausea and vomiting.  Endocrine: Negative for polyphagia and polyuria.  Musculoskeletal: Positive for myalgias.  Skin: Negative for pallor and rash.  Allergic/Immunologic: Negative for immunocompromised state.  Neurological: Negative for headaches.  Hematological: Does not bruise/bleed easily.  Psychiatric/Behavioral: Negative.       Objective:    Physical Exam  Constitutional: She is oriented to person, place, and time. She appears well-developed and well-nourished. No distress.  HENT:  Head: Normocephalic and atraumatic.  Right  Ear: External ear normal.  Left Ear: External ear normal.  Eyes: Conjunctivae are normal. Right eye exhibits no discharge. Left eye exhibits no discharge. No scleral icterus.  Neck: No JVD present. No tracheal deviation present.  Pulmonary/Chest: Effort normal. No stridor.  Neurological: She is alert and oriented to person, place, and time.  Skin: She is not diaphoretic.  Psychiatric: She has a normal mood and affect. Her behavior is normal.    Temp 98.9 F (37.2 C) (Oral)   Ht 5\' 2"  (1.575 m)   BMI 29.26 kg/m  Wt Readings from Last 3 Encounters:  02/25/18 160 lb (72.6 kg)  01/15/18 161 lb (73 kg)  01/08/18 160 lb 4.4 oz (72.7  kg)     Health Maintenance Due  Topic Date Due  . TETANUS/TDAP  07/14/1984  . COLONOSCOPY  07/15/2015  . MAMMOGRAM  09/23/2017  . PAP SMEAR-Modifier  08/27/2018    There are no preventive care reminders to display for this patient.  Lab Results  Component Value Date   TSH 1.04 10/16/2017   Lab Results  Component Value Date   WBC 6.4 01/08/2018   HGB 12.6 01/08/2018   HCT 38.8 01/08/2018   MCV 85.7 01/08/2018   PLT 184 01/08/2018   Lab Results  Component Value Date   NA 138 01/08/2018   K 3.7 01/08/2018   CO2 26 01/08/2018   GLUCOSE 96 01/08/2018   BUN 10 01/08/2018   CREATININE 0.87 01/08/2018   BILITOT 0.4 08/01/2017   ALKPHOS 70 08/01/2017   AST 16 08/01/2017   ALT 20 08/01/2017   PROT 6.8 08/01/2017   ALBUMIN 3.7 08/01/2017   CALCIUM 8.8 (L) 01/08/2018   ANIONGAP 8 01/08/2018   GFR 93.38 08/01/2017   Lab Results  Component Value Date   CHOL 120 02/25/2018   Lab Results  Component Value Date   HDL 52 02/25/2018   Lab Results  Component Value Date   LDLCALC 47 02/25/2018   Lab Results  Component Value Date   TRIG 104 02/25/2018   Lab Results  Component Value Date   CHOLHDL 2.3 02/25/2018   No results found for: HGBA1C    Assessment & Plan:   Problem List Items Addressed This Visit      Other   Acute  viral syndrome - Primary      No orders of the defined types were placed in this encounter.   Follow-up: Return in about 4 days (around 10/29/2018).    Mliss Sax, MD Provider:    I discussed the limitations of evaluation and management by telemedicine and the availability of in person appointments. The patient expressed understanding and agreed to proceed.  History of Present Illness:    Observations/Objective:   Assessment and Plan:   Follow Up Instructions:    I discussed the assessment and treatment plan with the patient. The patient was provided an opportunity to ask questions and all were answered. The patient agreed with the plan and demonstrated an understanding of the instructions.   The patient was advised to call back or seek an in-person evaluation if the symptoms worsen or if the condition fails to improve as anticipated.  I provided 15 minutes of non-face-to-face time during this encounter.  Recommending a COVID tested and has been requested.  Discussed self quarantine at home.  Isolating herself and her own room as much as possible and wearing a mask when she does have to move through the rest of the home.  Discussed proceeding to the emergency room with any difficulty breathing.  Would place her at higher risk for COVID with her occupational exposures.

## 2018-10-28 LAB — NOVEL CORONAVIRUS, NAA: SARS-CoV-2, NAA: NOT DETECTED

## 2018-10-31 ENCOUNTER — Other Ambulatory Visit: Payer: Self-pay | Admitting: Family Medicine

## 2018-10-31 DIAGNOSIS — E559 Vitamin D deficiency, unspecified: Secondary | ICD-10-CM

## 2018-11-01 ENCOUNTER — Other Ambulatory Visit: Payer: Self-pay | Admitting: Cardiology

## 2018-11-01 MED ORDER — TICAGRELOR 90 MG PO TABS: 90.0000 mg | ORAL_TABLET | Freq: Two times a day (BID) | ORAL | 0 refills | Status: DC

## 2018-11-01 NOTE — Telephone Encounter (Signed)
°*  STAT* If patient is at the pharmacy, call can be transferred to refill team.   1. Which medications need to be refilled? (please list name of each medication and dose if known) ticagrelor (BRILINTA) 90 MG TABS   2. Which pharmacy/location (including street and city if local pharmacy) is medication to be sent to?  [id number] - [place], [place] - [address] AT [facility] OF [address] [phone] (Phone) [phone] (Fax)    3. Do they need a 30 day or 90 day supply? 90 day

## 2018-11-01 NOTE — Telephone Encounter (Signed)
Rx for Brilinta 90mg  one tablet twice daily sent to pharmacy as requested.

## 2018-11-04 ENCOUNTER — Telehealth: Payer: Self-pay | Admitting: Family Medicine

## 2018-11-04 ENCOUNTER — Ambulatory Visit (INDEPENDENT_AMBULATORY_CARE_PROVIDER_SITE_OTHER): Payer: Federal, State, Local not specified - PPO | Admitting: Family Medicine

## 2018-11-04 ENCOUNTER — Encounter: Payer: Self-pay | Admitting: Family Medicine

## 2018-11-04 DIAGNOSIS — Z8619 Personal history of other infectious and parasitic diseases: Secondary | ICD-10-CM | POA: Insufficient documentation

## 2018-11-04 HISTORY — DX: Personal history of other infectious and parasitic diseases: Z86.19

## 2018-11-04 NOTE — Telephone Encounter (Signed)
Papers completed, waiting on physician's signature.

## 2018-11-04 NOTE — Telephone Encounter (Addendum)
Patient called and had daughter to drop off FMLA papers that needs to be completed. Please contact patient when complete. FMLA papers are in Dr.Kremers's file in the front office.

## 2018-11-04 NOTE — Telephone Encounter (Signed)
Papers received. Will notify patient once they are ready.

## 2018-11-04 NOTE — Progress Notes (Signed)
Established Patient Office Visit  Subjective:  Patient ID: Maria Myers, female    DOB: 09-27-1965  Age: 53 y.o. MRN: [id number]  CC:  Chief Complaint  Patient presents with  . Follow-up    HPI Maria Myers presents for follow-up of her febrile illness.  He is back to normal.  Energy level is good she has been doing some work around the house.  Pain is isolated home.  Low-grade fever, sore throat, cough shortness of breath or difficulty breathing.  No arthralgias or myalgias.  Wearing a mask at work.  Past Medical History:  Diagnosis Date  . Allergy   . Coronary artery disease    01/07/18 PCI/DES x1, normal EF via LV gram.   . Thyroid disease     Past Surgical History:  Procedure Laterality Date  . CESAREAN SECTION    . CORONARY STENT INTERVENTION N/A 01/07/2018   Procedure: CORONARY STENT INTERVENTION;  Surgeon: Kathleene Hazel, MD;  Location: [facility];  Service: Cardiovascular;  Laterality: N/A;  . LEFT HEART CATH AND CORONARY ANGIOGRAPHY N/A 01/07/2018   Procedure: LEFT HEART CATH AND CORONARY ANGIOGRAPHY;  Surgeon: Kathleene Hazel, MD;  Location: [facility];  Service: Cardiovascular;  Laterality: N/A;    Family History  Problem Relation Age of Onset  . Arthritis Sister     Social History   Socioeconomic History  . Marital status: Married    Spouse name: Not on file  . Number of children: Not on file  . Years of education: Not on file  . Highest education level: Not on file  Occupational History  . Not on file  Social Needs  . Financial resource strain: Not on file  . Food insecurity:    Worry: Not on file    Inability: Not on file  . Transportation needs:    Medical: Not on file    Non-medical: Not on file  Tobacco Use  . Smoking status: Never Smoker  . Smokeless tobacco: Never Used  Substance and Sexual Activity  . Alcohol use: Never    Frequency: Never  . Drug use: No  . Sexual activity: Not on file  Lifestyle  . Physical  activity:    Days per week: Not on file    Minutes per session: Not on file  . Stress: Not on file  Relationships  . Social connections:    Talks on phone: Not on file    Gets together: Not on file    Attends religious service: Not on file    Active member of club or organization: Not on file    Attends meetings of clubs or organizations: Not on file    Relationship status: Not on file  . Intimate partner violence:    Fear of current or ex partner: Not on file    Emotionally abused: Not on file    Physically abused: Not on file    Forced sexual activity: Not on file  Other Topics Concern  . Not on file  Social History Narrative  . Not on file    Outpatient Medications Prior to Visit  Medication Sig Dispense Refill  . albuterol (PROVENTIL HFA;VENTOLIN HFA) 108 (90 Base) MCG/ACT inhaler Inhale 2 puffs into the lungs every 4 (four) hours as needed for wheezing or shortness of breath. 1 Inhaler 1  . aspirin EC 81 MG tablet Take 1 tablet (81 mg total) by mouth daily. 90 tablet 3  . atorvastatin (LIPITOR) 40 MG tablet TAKE  1 TABLET BY MOUTH EVERY DAY AT 6 PM 90 tablet 1  . fluticasone (FLONASE) 50 MCG/ACT nasal spray Place 2 sprays into both nostrils daily. 16 g 6  . levothyroxine (SYNTHROID, LEVOTHROID) 75 MCG tablet TAKE 1 TABLET(75 MCG) BY MOUTH DAILY BEFORE BREAKFAST 90 tablet 1  . metoprolol tartrate (LOPRESSOR) 25 MG tablet TAKE 1/2 TABLET(12.5 MG) BY MOUTH TWICE DAILY 90 tablet 0  . Multiple Vitamin (MULTIVITAMIN WITH MINERALS) TABS tablet Take 1 tablet by mouth 4 (four) times a week.     . nitroGLYCERIN (NITROSTAT) 0.4 MG SL tablet Place 0.4 mg under the tongue every 5 (five) minutes as needed for chest pain.    Marland Kitchen. omeprazole (PRILOSEC) 40 MG capsule Take 40 mg by mouth daily before supper.    B[employer]ie [employer]. OS-CAL CALCIUM + D3 500-200 MG-UNIT TABS TAKE 1 TABLET BY MOUTH TWICE DAILY 180 tablet 3  . ticagrelor (BRILINTA) 90 MG TABS tablet Take 1 tablet (90 mg total) by mouth 2 (two) times  daily. 180 tablet 0  . Vitamin D, Ergocalciferol, (DRISDOL) 50000 units CAPS capsule Take 1 capsule (50,000 Units total) by mouth every 7 (seven) days. (Patient taking differently: Take 50,000 Units by mouth every Sunday. ) 5 capsule 2   No facility-administered medications prior to visit.     No Known Allergies  ROS Review of Systems  Constitutional: Negative for chills, diaphoresis, fatigue, fever and unexpected weight change.  HENT: Negative.   Eyes: Negative.   Respiratory: Negative for choking, chest tightness, shortness of breath and wheezing.   Cardiovascular: Negative.   Musculoskeletal: Negative for arthralgias and myalgias.  Skin: Negative for pallor and rash.  Neurological: Negative for weakness and headaches.  Psychiatric/Behavioral: Negative.       Objective:    Physical Exam  Constitutional: She is oriented to person, place, and time. She appears well-developed and well-nourished. No distress.  HENT:  Head: Normocephalic and atraumatic.  Right Ear: External ear normal.  Left Ear: External ear normal.  Eyes: Right eye exhibits no discharge. Left eye exhibits no discharge. No scleral icterus.  Neck: No JVD present. No tracheal deviation present.  Cardiovascular: Normal heart sounds.  Pulmonary/Chest: Effort normal. No stridor.  Neurological: She is alert and oriented to person, place, and time.  Skin: She is not diaphoretic.  Psychiatric: She has a normal mood and affect.    There were no vitals taken for this visit. Wt Readings from Last 3 Encounters:  02/25/18 160 lb (72.6 kg)  01/15/18 161 lb (73 kg)  01/08/18 160 lb 4.4 oz (72.7 kg)     Health Maintenance Due  Topic Date Due  . TETANUS/TDAP  07/14/1984  . COLONOSCOPY  07/15/2015  . MAMMOGRAM  09/23/2017  . PAP SMEAR-Modifier  08/27/2018    There are no preventive care reminders to display for this patient.  Lab Results  Component Value Date   TSH 1.04 10/16/2017   Lab Results  Component  Value Date   WBC 6.4 01/08/2018   HGB 12.6 01/08/2018   HCT 38.8 01/08/2018   MCV 85.7 01/08/2018   PLT 184 01/08/2018   Lab Results  Component Value Date   NA 138 01/08/2018   K 3.7 01/08/2018   CO2 26 01/08/2018   GLUCOSE 96 01/08/2018   BUN 10 01/08/2018   CREATININE 0.87 01/08/2018   BILITOT 0.4 08/01/2017   ALKPHOS 70 08/01/2017   AST 16 08/01/2017   ALT 20 08/01/2017   PROT 6.8 08/01/2017   ALBUMIN 3.7 08/01/2017  CALCIUM 8.8 (L) 01/08/2018   ANIONGAP 8 01/08/2018   GFR 93.38 08/01/2017   Lab Results  Component Value Date   CHOL 120 02/25/2018   Lab Results  Component Value Date   HDL 52 02/25/2018   Lab Results  Component Value Date   LDLCALC 47 02/25/2018   Lab Results  Component Value Date   TRIG 104 02/25/2018   Lab Results  Component Value Date   CHOLHDL 2.3 02/25/2018   No results found for: HGBA1C    Assessment & Plan:   Problem List Items Addressed This Visit      Other   History of viral illness - Primary      No orders of the defined types were placed in this encounter.   Follow-up: Return if symptoms worsen or fail to improve.    Mliss Sax, MDVirtual Visit via Video Note  I connected with Maria Myers on 11/04/18 at  2:00 PM EDT by a video enabled telemedicine application and verified that I am speaking with the correct person using two identifiers.  Location: Patient: home Provider:    I discussed the limitations of evaluation and management by telemedicine and the availability of in person appointments. The patient expressed understanding and agreed to proceed.  History of Present Illness:    Observations/Objective:   Assessment and Plan:   Follow Up Instructions:    I discussed the assessment and treatment plan with the patient. The patient was provided an opportunity to ask questions and all were answered. The patient agreed with the plan and demonstrated an understanding of the instructions.   The  patient was advised to call back or seek an in-person evaluation if the symptoms worsen or if the condition fails to improve as anticipated.  I provided 12 minutes of non-face-to-face time during this encounter.   Patient will continue to practice social distancing cleaning wearing a mask when up and about.  She will follow-up with any changes whatsoever

## 2018-11-05 ENCOUNTER — Other Ambulatory Visit: Payer: Self-pay | Admitting: Family Medicine

## 2018-11-05 DIAGNOSIS — E559 Vitamin D deficiency, unspecified: Secondary | ICD-10-CM

## 2018-11-05 NOTE — Telephone Encounter (Signed)
Patient is aware that paperwork is completed & ready for pick up. Copy of paperwork sent to scan.

## 2019-01-09 ENCOUNTER — Other Ambulatory Visit: Payer: Self-pay | Admitting: Cardiology

## 2019-01-29 ENCOUNTER — Other Ambulatory Visit: Payer: Self-pay | Admitting: Cardiology

## 2019-02-09 ENCOUNTER — Other Ambulatory Visit: Payer: Self-pay | Admitting: Cardiology

## 2019-02-12 IMAGING — DX DG CHEST 2V
2 series · 2 of 2 positions shown · non-contrast
Comparison: None.

CLINICAL DATA: Intermittent chest pain for a few days.

EXAM:
CHEST  2 VIEW

[chest pa]
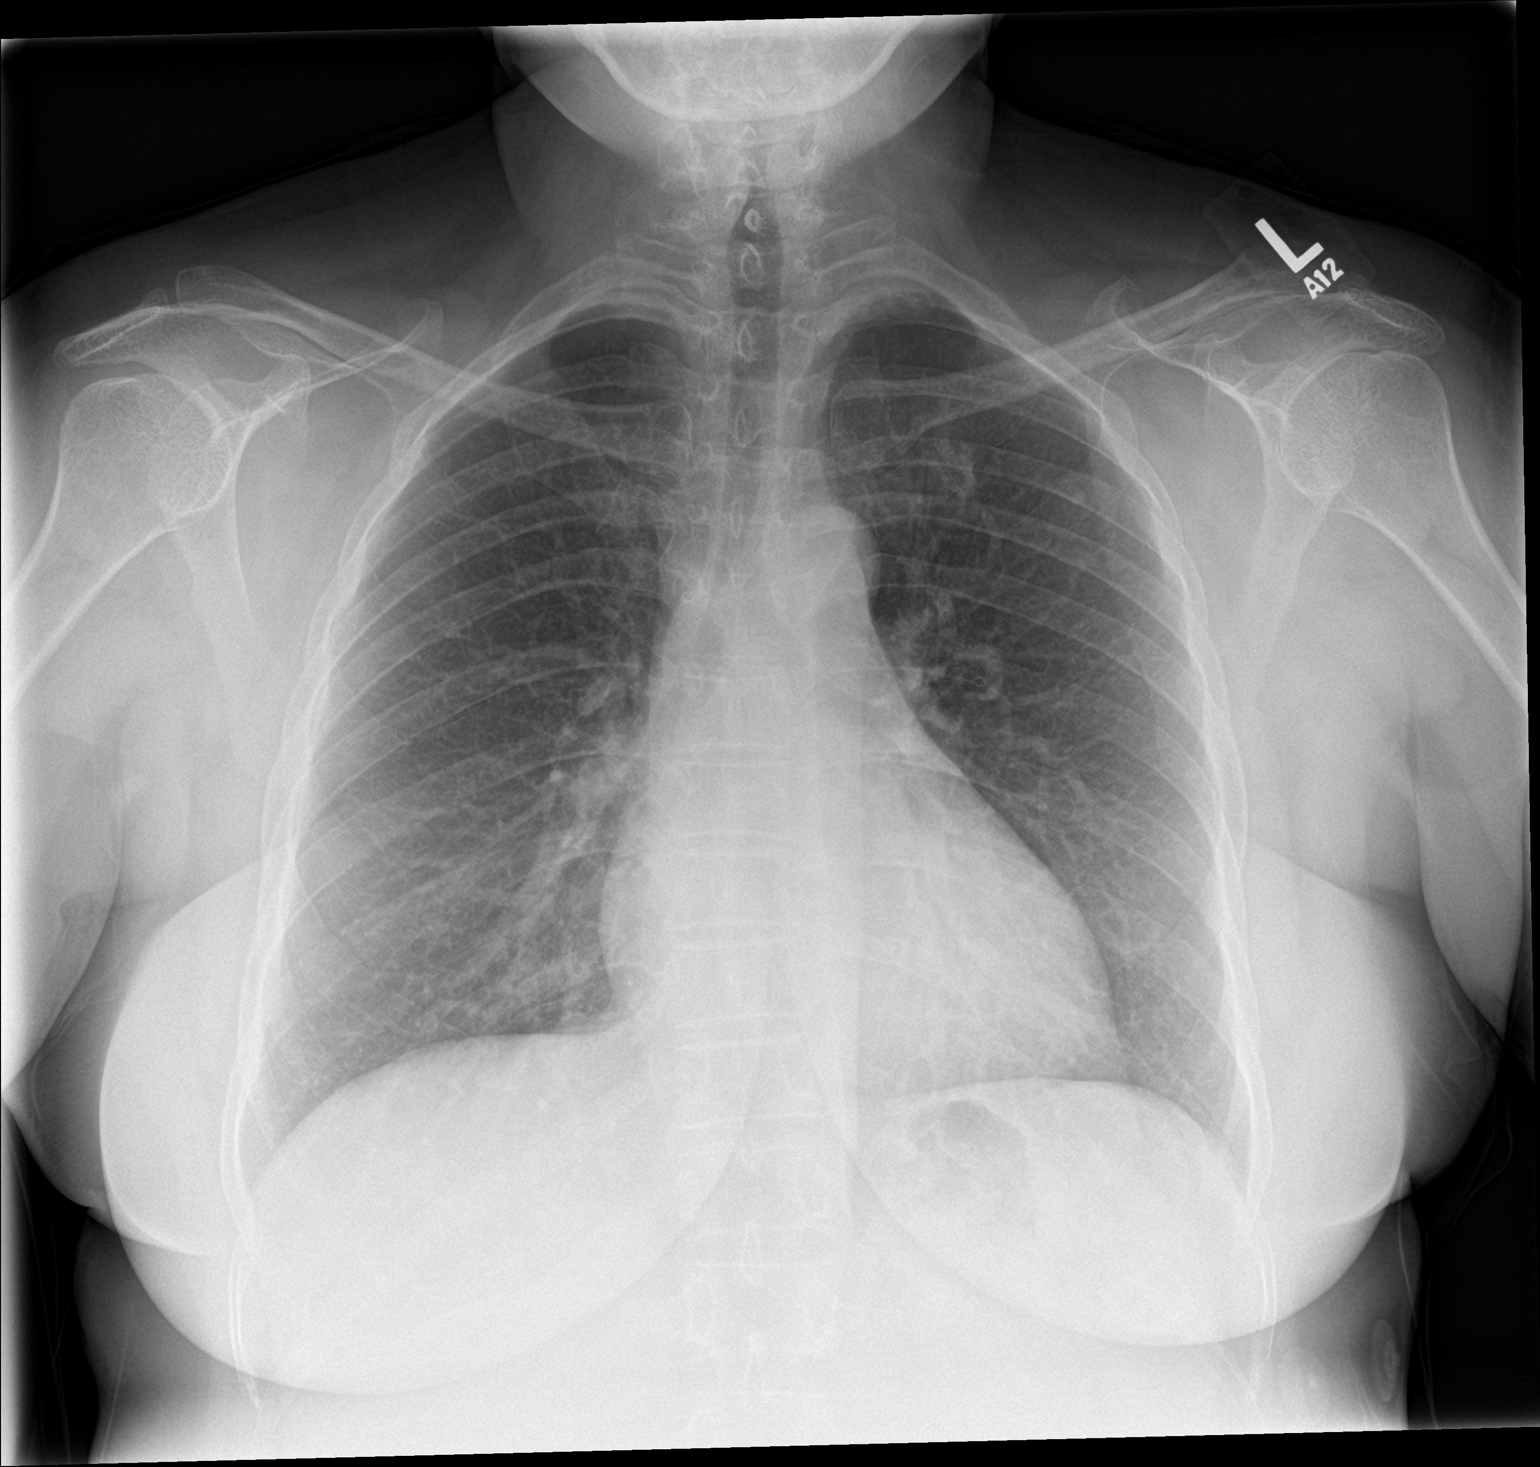

[chest lat]
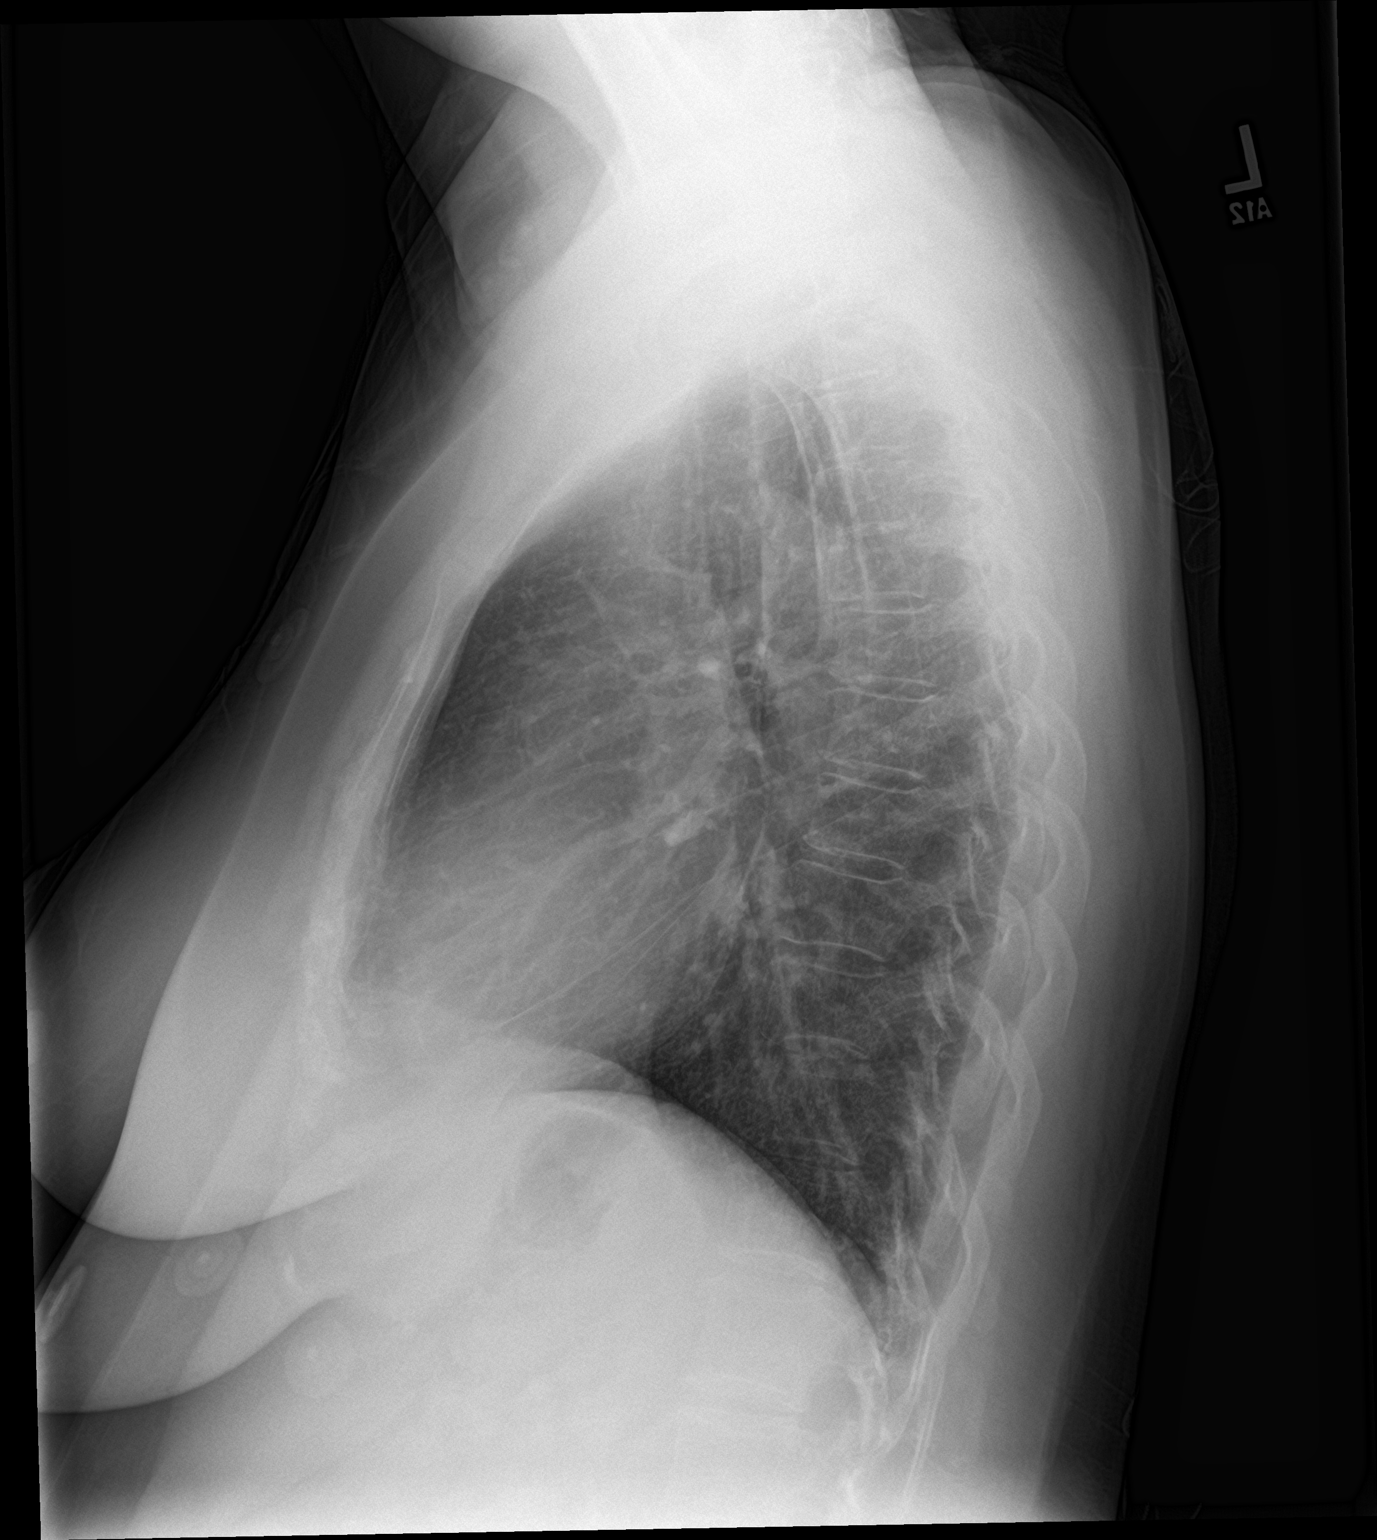

[2 of 2 positions shown; findings below may reference images not displayed]

FINDINGS: The cardiomediastinal silhouette is within normal limits. The lungs
are well inflated and clear. There is no evidence of pleural
effusion or pneumothorax. No acute osseous abnormality is
identified.
IMPRESSION: No active cardiopulmonary disease.

## 2019-02-18 ENCOUNTER — Other Ambulatory Visit: Payer: Self-pay

## 2019-02-18 ENCOUNTER — Encounter: Payer: Self-pay | Admitting: Emergency Medicine

## 2019-02-18 ENCOUNTER — Ambulatory Visit (INDEPENDENT_AMBULATORY_CARE_PROVIDER_SITE_OTHER): Payer: Federal, State, Local not specified - PPO | Admitting: Cardiology

## 2019-02-18 ENCOUNTER — Encounter: Payer: Self-pay | Admitting: Cardiology

## 2019-02-18 VITALS — BP 140/70 | HR 53 | Ht 62.0 in | Wt 162.4 lb

## 2019-02-18 DIAGNOSIS — Z955 Presence of coronary angioplasty implant and graft: Secondary | ICD-10-CM

## 2019-02-18 DIAGNOSIS — I2510 Atherosclerotic heart disease of native coronary artery without angina pectoris: Secondary | ICD-10-CM

## 2019-02-18 DIAGNOSIS — E785 Hyperlipidemia, unspecified: Secondary | ICD-10-CM | POA: Diagnosis not present

## 2019-02-18 HISTORY — DX: Hyperlipidemia, unspecified: E78.5

## 2019-02-18 MED ORDER — METOPROLOL SUCCINATE ER 25 MG PO TB24: 25.0000 mg | ORAL_TABLET | Freq: Every day | ORAL | 1 refills | Status: DC

## 2019-02-18 NOTE — Progress Notes (Signed)
Cardiology Office Note:    Date:  02/18/2019   ID:  Maria Myers, DOB May 14, 1966, MRN [id number]  PCP:  Mliss Sax, MD  Cardiologist:  Gypsy Balsam, MD    Referring MD: Mliss Sax,*   Chief Complaint  Patient presents with  . Follow-up  . Medication Refill  Doing very well  History of Present Illness:    Maria Myers is a 53 y.o. female with coronary disease PTCA and drug-eluting stent to proximal LAD in January 07, 2018, normal LV gram.  Dyslipidemia comes today to my office for follow-up overall doing great asymptomatic no chest pain tightness squeezing pressure burning chest.  She complained of having some bruising however I told her she need to finish Brilinta whatever she had left and simply stop it if we will be more than year of dual antiplatelets therapy.  We will check her fasting lipid profile today.  She is doing very well she is walking about 15,000 steps every single day have no difficulty doing it.  Past Medical History:  Diagnosis Date  . Allergy   . Coronary artery disease    01/07/18 PCI/DES x1, normal EF via LV gram.   . Thyroid disease     Past Surgical History:  Procedure Laterality Date  . CESAREAN SECTION    . CORONARY STENT INTERVENTION N/A 01/07/2018   Procedure: CORONARY STENT INTERVENTION;  Surgeon: Kathleene Hazel, MD;  [facility]: [facility];  Service: Cardiovascular;  Laterality: N/A;  . LEFT HEART CATH AND CORONARY ANGIOGRAPHY N/A 01/07/2018   Procedure: LEFT HEART CATH AND CORONARY ANGIOGRAPHY;  Surgeon: Kathleene Hazel, MD;  Location: [facility];  Service: Cardiovascular;  Laterality: N/A;    Current Medications: Current Meds  Medication Sig  . albuterol (PROVENTIL HFA;VENTOLIN HFA) 108 (90 Base) MCG/ACT inhaler Inhale 2 puffs into the lungs every 4 (four) hours as needed for wheezing or shortness of breath.  Marland Kitchen aspirin EC 81 MG tablet Take 1 tablet (81 mg total) by mouth daily.  Marland Kitchen atorvastatin  (LIPITOR) 40 MG tablet TAKE 1 TABLET BY MOUTH EVERY DAY AT 6 PM  . fluticasone (FLONASE) 50 MCG/ACT nasal spray Place 2 sprays into both nostrils daily.  Marland Kitchen levothyroxine (SYNTHROID, LEVOTHROID) 75 MCG tablet TAKE 1 TABLET(75 MCG) BY MOUTH DAILY BEFORE BREAKFAST  . metoprolol tartrate (LOPRESSOR) 25 MG tablet Take 0.5 tablets (12.5 mg total) by mouth 2 (two) times daily. NEED OFFICE VISIT FOR MORE REFILLS  . Multiple Vitamin (MULTIVITAMIN WITH MINERALS) TABS tablet Take 1 tablet by mouth 4 (four) times a week.   . nitroGLYCERIN (NITROSTAT) 0.4 MG SL tablet Place 0.4 mg under the tongue every 5 (five) minutes as needed for chest pain.  Marland Kitchen OS-CAL CALCIUM + D3 500-200 MG-UNIT TABS TAKE 1 TABLET BY MOUTH TWICE DAILY  . ticagrelor (BRILINTA) 90 MG TABS tablet Take 1 tablet (90 mg total) by mouth 2 (two) times daily. NEED OFFICE VISIT FOR MORE REFILLS  . Vitamin D, Ergocalciferol, (DRISDOL) 50000 units CAPS capsule Take 1 capsule (50,000 Units total) by mouth every 7 (seven) days. (Patient taking differently: Take 50,000 Units by mouth every Sunday. )     Allergies:   Patient has no known allergies.   Social History   Socioeconomic History  . Marital status: Married    Spouse name: Not on file  . Number of children: Not on file  . Years of education: Not on file  . Highest education level: Not on file  Occupational History  . Not on file  Social Needs  . Financial resource strain: Not on file  . Food insecurity    Worry: Not on file    Inability: Not on file  . Transportation needs    Medical: Not on file    Non-medical: Not on file  Tobacco Use  . Smoking status: Never Smoker  . Smokeless tobacco: Never Used  Substance and Sexual Activity  . Alcohol use: Never    Frequency: Never  . Drug use: No  . Sexual activity: Not on file  Lifestyle  . Physical activity    Days per week: Not on file    Minutes per session: Not on file  . Stress: Not on file  Relationships  . Social  Herbalist on phone: Not on file    Gets together: Not on file    Attends religious service: Not on file    Active member of club or organization: Not on file    Attends meetings of clubs or organizations: Not on file    Relationship status: Not on file  Other Topics Concern  . Not on file  Social History Narrative  . Not on file     Family History: The patient's family history includes Arthritis in her sister. ROS:   Please see the history of present illness.    All 14 point review of systems negative except as described per history of present illness  EKGs/Labs/Other Studies Reviewed:      Recent Labs: No results found for requested labs within last 8760 hours.  Recent Lipid Panel    Component Value Date/Time   CHOL 120 02/25/2018 1635   TRIG 104 02/25/2018 1635   HDL 52 02/25/2018 1635   CHOLHDL 2.3 02/25/2018 1635   CHOLHDL 3 08/01/2017 1210   VLDL 29.4 08/01/2017 1210   LDLCALC 47 02/25/2018 1635    Physical Exam:    VS:  BP 140/70   Pulse (!) 53   Ht 5\' 2"  (1.575 m)   Wt 162 lb 6.4 oz (73.7 kg)   SpO2 99%   BMI 29.70 kg/m     Wt Readings from Last 3 Encounters:  02/18/19 162 lb 6.4 oz (73.7 kg)  02/25/18 160 lb (72.6 kg)  01/15/18 161 lb (73 kg)     GEN:  Well nourished, well developed in no acute distress HEENT: Normal NECK: No JVD; No carotid bruits LYMPHATICS: No lymphadenopathy CARDIAC: RRR, no murmurs, no rubs, no gallops RESPIRATORY:  Clear to auscultation without rales, wheezing or rhonchi  ABDOMEN: Soft, non-tender, non-distended MUSCULOSKELETAL:  No edema; No deformity  SKIN: Warm and dry LOWER EXTREMITIES: no swelling NEUROLOGIC:  Alert and oriented x 3 PSYCHIATRIC:  Normal affect   ASSESSMENT:    1. Coronary artery disease involving native coronary artery of native heart without angina pectoris   2. Dyslipidemia   3. Status post coronary artery stent placement    PLAN:    In order of problems listed above:  1.  Coronary disease stable plan as outlined above she will finish Brilinta and simply discontinue rest of the medication will be the same. 2. Dyslipidemia we will check a fasting lipid profile today 3. Status post coronary artery stenting doing well.   Medication Adjustments/Labs and Tests Ordered: Current medicines are reviewed at length with the patient today.  Concerns regarding medicines are outlined above.  No orders of the defined types were placed in this encounter.  Medication changes: No  orders of the defined types were placed in this encounter.   Signed, Georgeanna Leaobert J. Keymon Mcelroy, MD, [facility] 02/18/2019 11:45 AM     [facility]

## 2019-02-18 NOTE — Patient Instructions (Signed)
Medication Instructions:  Your physician has recommended you make the following change in your medication:  Stop: Metoprolol tartrate  Start: Metoprolol succinate 25 mg daily   If you need a refill on your cardiac medications before your next appointment, please call your pharmacy.   Lab work: Your physician recommends that you return for lab work today: Lipids   If you have labs (blood work) drawn today and your tests are completely normal, you will receive your results only by: Marland Kitchen. MyChart Message (if you have MyChart) OR . A paper copy in the mail If you have any lab test that is abnormal or we need to change your treatment, we will call you to review the results.  Testing/Procedures: None.   Follow-Up: At [facility], you and your health needs are our priority.  As part of our continuing mission to provide you with exceptional heart care, we have created designated Provider Care Teams.  These Care Teams include your primary Cardiologist (physician) and Advanced Practice Providers (APPs -  Physician Assistants and Nurse Practitioners) who all work together to provide you with the care you need, when you need it. You will need a follow up appointment in 5 months.  Please call our office 2 months in advance to schedule this appointment.  You may see No primary care provider on file. or another member of our [facility] [place] Team in [place]: Norman HerrlichBrian Munley, MD . Belva Cromeajan Revankar, MD  Any Other Special Instructions Will Be Listed Below (If Applicable).  Metoprolol extended-release capsules What is this medicine? METOPROLOL (me TOE proe lole) is a beta-blocker. Beta-blockers reduce the workload on the heart and help it to beat more regularly. This medicine is used to treat high blood pressure and to prevent chest pain. It is also used after a heart attack to prevent an additional heart attack from occurring. This medicine may be used for other purposes; ask your health care  provider or pharmacist if you have questions. COMMON BRAND NAME(S): KAPSPARGO What should I tell my health care provider before I take this medicine? They need to know if you have any of these conditions:  diabetes  heart disease  liver disease  lung or breathing disease, like asthma  pheochromocytoma  thyroid disease  an unusual or allergic reaction to metoprolol, other beta-blockers, medicines, foods, dyes, or preservatives  pregnant or trying to get pregnant  breast-feeding How should I use this medicine? Take this medicine by mouth. The capsules can be swallowed whole or opened carefully and the contents sprinkled over a small amount (teaspoonful) of soft food, such as applesauce, pudding, or yogurt. This mixture must be swallowed within 60 minutes and not stored for future use. Do not chew this medicine. You can take it with or without food. If it upsets your stomach, take it with food. Take your medicine at regular intervals. Do not take it more often than directed. Do not stop taking except on your doctor's advice. Talk to your pediatrician regarding the use of this medicine in children. While this drug may be prescribed for children as young as 6 for selected conditions, precautions do apply. Overdosage: If you think you have taken too much of this medicine contact a poison control center or emergency room at once. NOTE: This medicine is only for you. Do not share this medicine with others. What if I miss a dose? If you miss a dose, take it as soon as you can. If it is almost time for your  next dose, take only that dose. Do not take double or extra doses. What may interact with this medicine? This medicine may interact with the following medications:  certain medicines for blood pressure, heart disease, irregular heart beat  epinephrine  fluoxetine  MAOIs like Carbex, Eldepryl, Marplan, Nardil, and Parnate  paroxetine  reserpine This list may not describe all  possible interactions. Give your health care provider a list of all the medicines, herbs, non-prescription drugs, or dietary supplements you use. Also tell them if you smoke, drink alcohol, or use illegal drugs. Some items may interact with your medicine. What should I watch for while using this medicine? You may get drowsy or dizzy. Do not drive, use machinery, or do anything that needs mental alertness until you know how this medicine affects you. Do not stand or sit up quickly, especially if you are an older patient. This reduces the risk of dizzy or fainting spells. Alcohol may interfere with the effect of this medicine. Avoid alcoholic drinks. Visit your doctor or health care professional for regular checks on your progress. Check your blood pressure as directed. Ask your doctor or health care professional what your blood pressure should be and when you should contact him or her. Do not treat yourself for coughs, colds, or pain while you are using this medicine without asking your doctor or health care professional for advice. Some ingredients may increase your blood pressure. This medicine may increase blood sugar. Ask your healthcare provider if changes in diet or medicines are needed if you have diabetes. What side effects may I notice from receiving this medicine? Side effects that you should report to your doctor or health care professional as soon as possible:  allergic reactions like skin rash, itching or hives, swelling of the face, lips, or tongue  cold hands or feet  signs and symptoms of high blood sugar such as being more thirsty or hungry or having to urinate more than normal. You may also feel very tired or have blurry vision.  signs and symptoms of low blood pressure like dizziness; feeling faint or lightheaded, falls; unusually weak or tired  signs of worsening heart failure like breathing problems, swelling in your legs and feet  suicidal thoughts or other mood  changes  unusually slow heartbeat Side effects that usually do not require medical attention (report these to your doctor or health care professional if they continue or are bothersome):  anxious  change in sex drive or performance  diarrhea  headache  trouble sleeping  upset stomach This list may not describe all possible side effects. Call your doctor for medical advice about side effects. You may report side effects to FDA at 1-800-FDA-1088. Where should I keep my medicine? Keep out of the reach of children. Store at room temperature between 15 and 30 degrees C (59 and 86 degrees F). Throw away any unused medicine after the expiration date. NOTE: This sheet is a summary. It may not cover all possible information. If you have questions about this medicine, talk to your doctor, pharmacist, or health care provider.  2020 Elsevier/Gold Standard (2018-03-12 11:07:10)

## 2019-02-18 NOTE — Addendum Note (Signed)
Addended by: Ashok Norris on: 02/18/2019 11:53 AM   Modules accepted: Orders

## 2019-02-19 LAB — LIPID PANEL
Chol/HDL Ratio: 2 ratio (ref 0.0–4.4)
Cholesterol, Total: 121 mg/dL (ref 100–199)
HDL: 60 mg/dL (ref >39)
LDL Chol Calc (NIH): 42 mg/dL (ref 0–99)
Triglycerides: 105 mg/dL (ref 0–149)
VLDL Cholesterol Cal: 19 mg/dL (ref 5–40)

## 2019-02-21 ENCOUNTER — Other Ambulatory Visit: Payer: Self-pay

## 2019-02-21 DIAGNOSIS — E039 Hypothyroidism, unspecified: Secondary | ICD-10-CM

## 2019-02-21 MED ORDER — LEVOTHYROXINE SODIUM 75 MCG PO TABS: ORAL_TABLET | ORAL | 1 refills | Status: DC

## 2019-02-26 IMAGING — CT CT NECK W/ CM
4 of 5 series · 15 of 33 positions shown, 17 images · IV contrast (ISOVUE 300)
Comparison: None.

CLINICAL DATA: Pain in the anterior neck over the last 2 weeks.
Assess for mass.

EXAM:
CT NECK WITH CONTRAST
TECHNIQUE: Multidetector CT imaging of the neck was performed using the
standard protocol following the bolus administration of intravenous
contrast.
CONTRAST:  75mL 643EXN-AHH IOPAMIDOL (643EXN-AHH) INJECTION 61%

[Series 3: neck 2.0 i31s 2 · axial · 0.55mm/px · z∈[-218,-96]mm · 3 of 123 slices shown]
[im 31/123  bone]
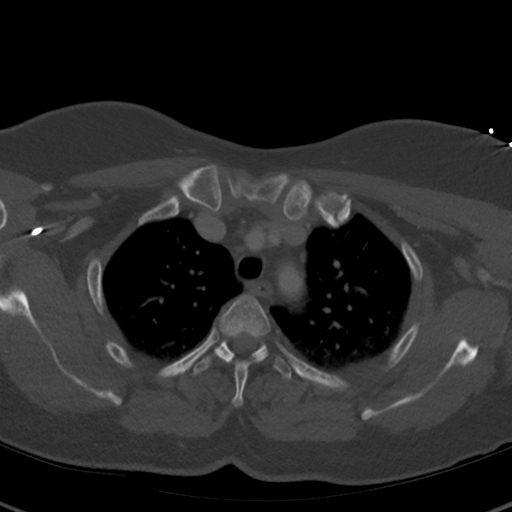
[im 62/123  bone]
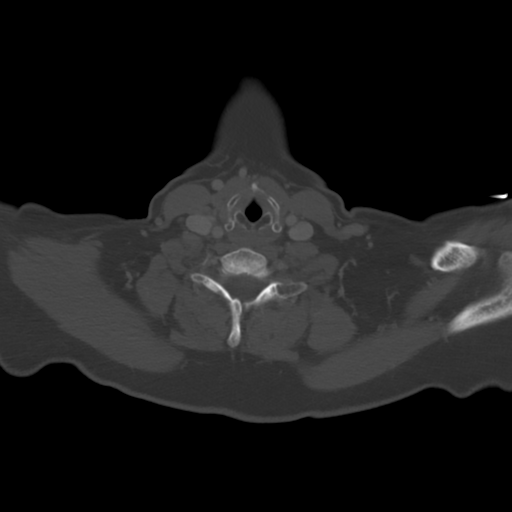
[im 92/123  bone]
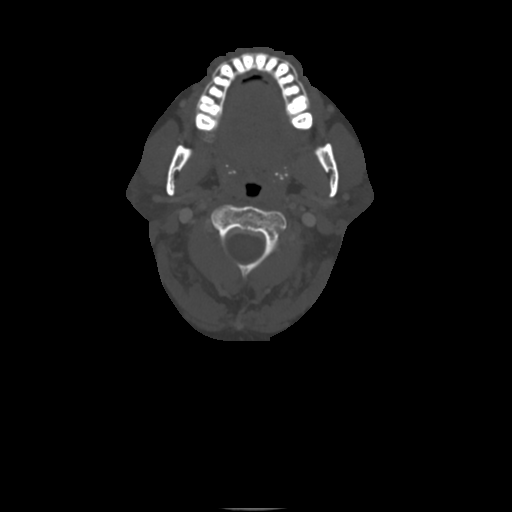

[Series 7: coronal st · coronal · 0.48mm/px · 3 of 118 slices shown]
[im 24/118  bone]
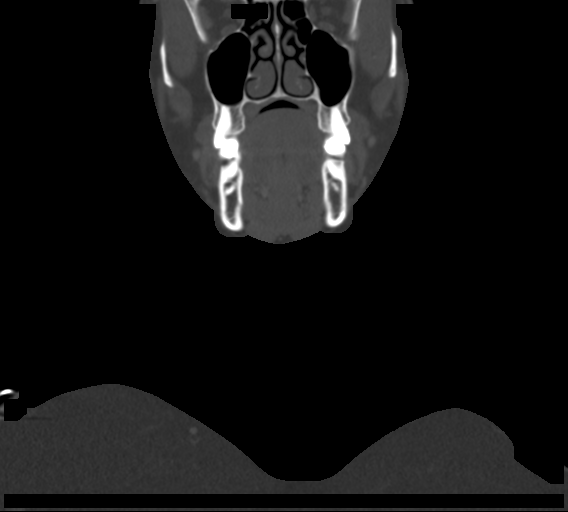
[im 47/118  bone]
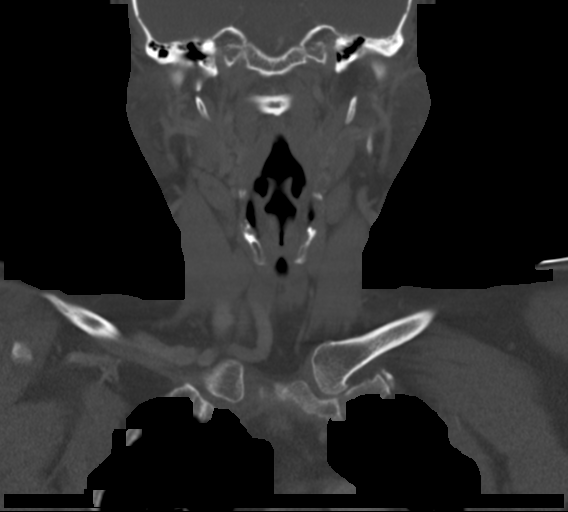
[im 71/118  bone]
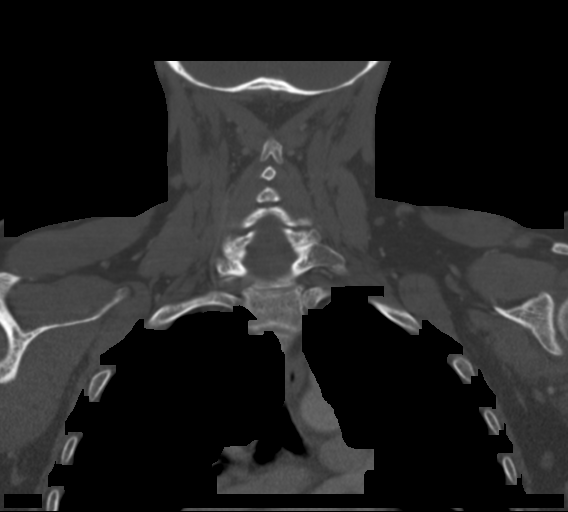

[Series 8: sagittal st · sagittal · 0.48mm/px · 5 of 156 slices shown, 6 images]
[im 52/156  bone]
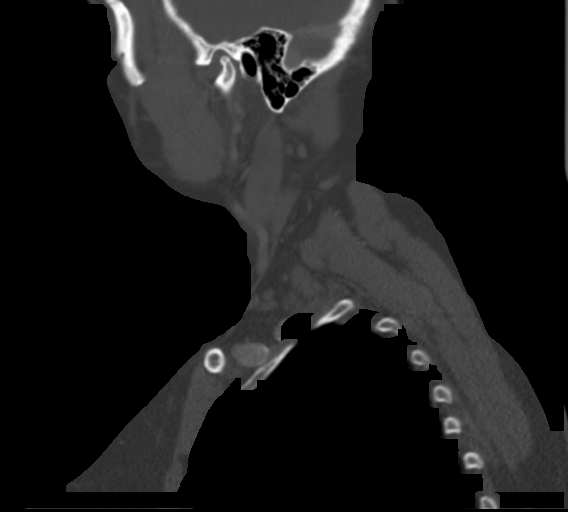
[im 65/156  bone]
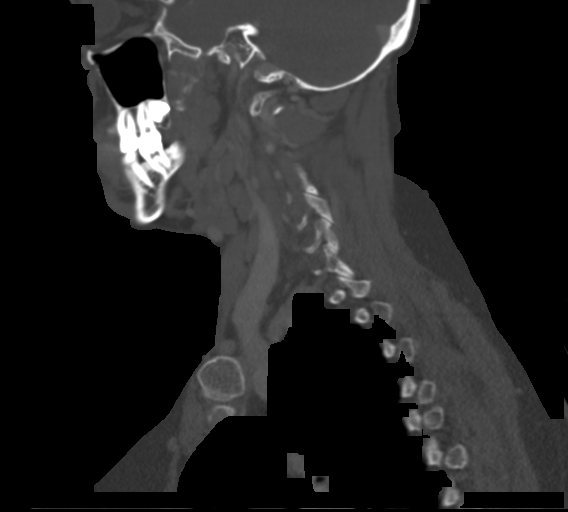
[im 78/156  soft-tissue]
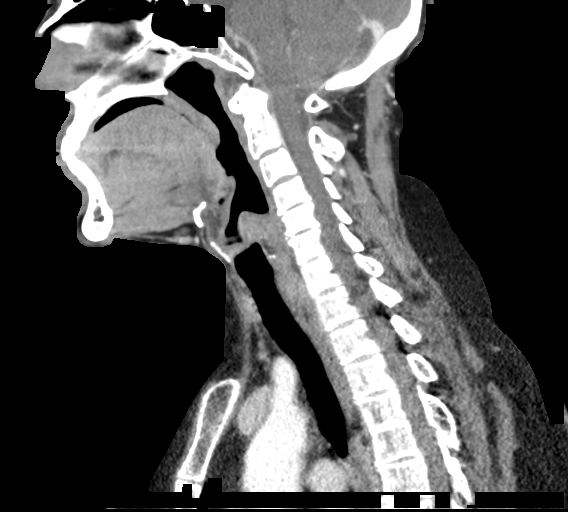
[im 78/156  bone]
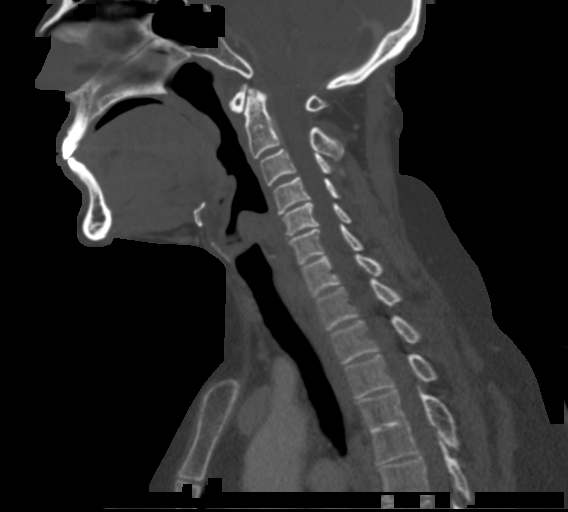
[im 91/156  bone]
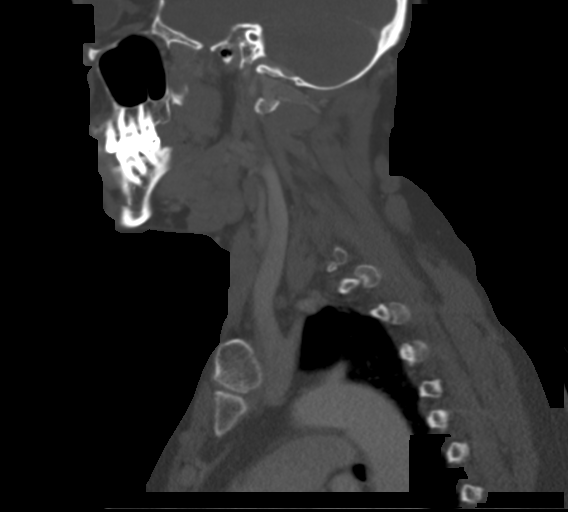
[im 104/156  bone]
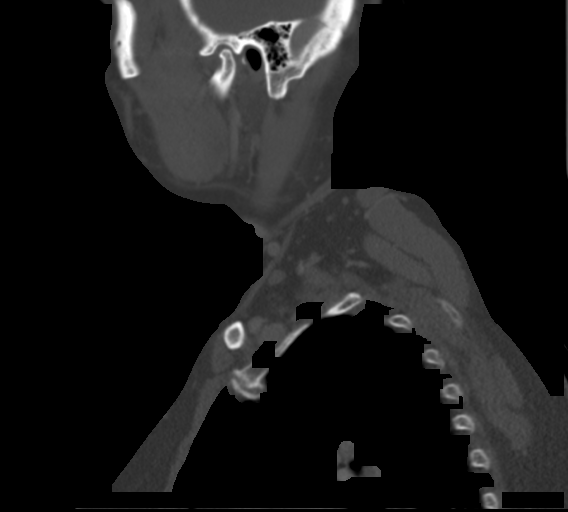

[Series 9: orthogonal · axial · 0.46mm/px · z∈[-263,-118]mm · 4 of 125 slices shown, 5 images]
[im 25/125  soft-tissue]
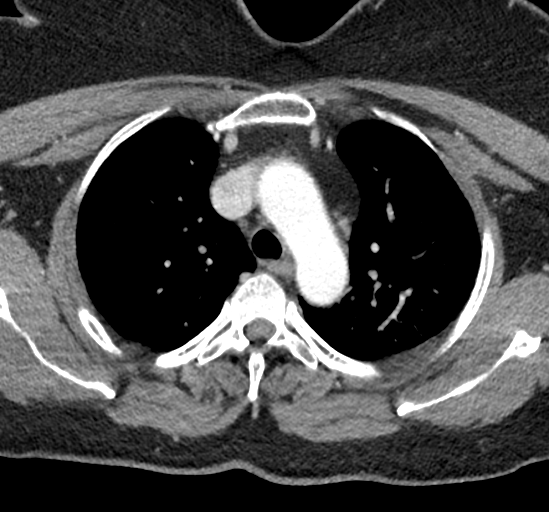
[im 25/125  bone]
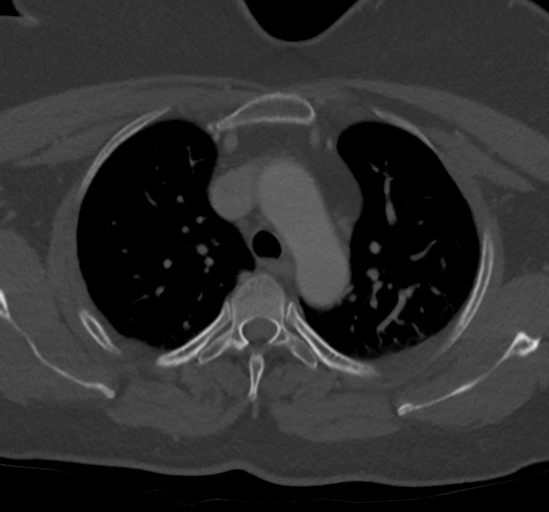
[im 50/125  bone]
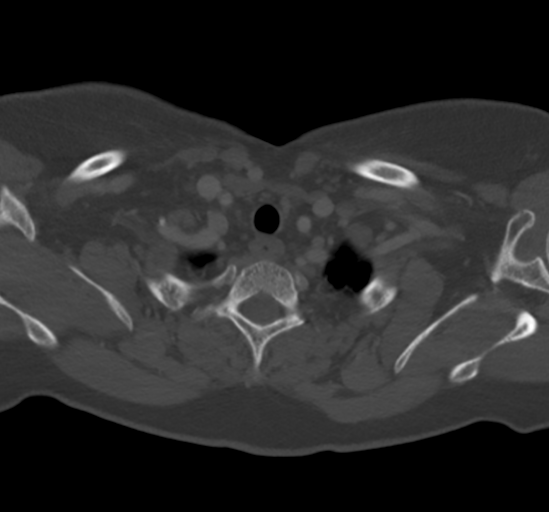
[im 75/125  bone]
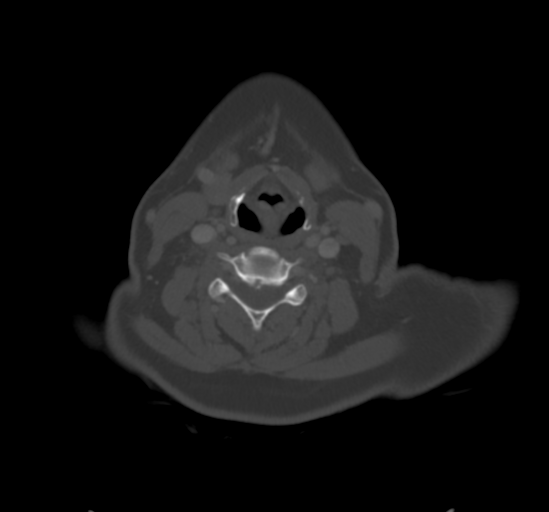
[im 100/125  bone]
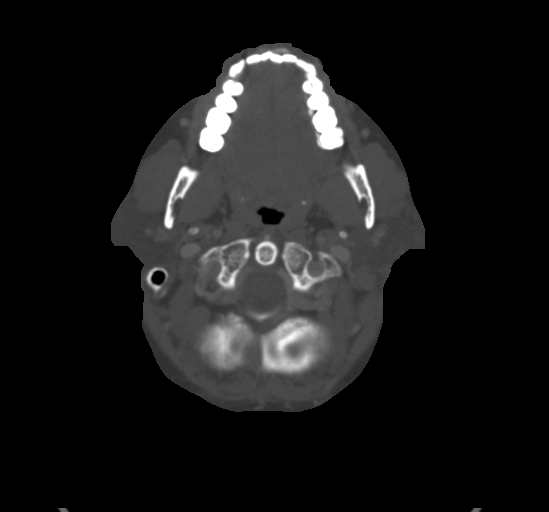

[15 of 33 positions shown; findings below may reference images not displayed]

FINDINGS: Pharynx and larynx: No mucosal or submucosal lesions seen.

Salivary glands: Parotid and submandibular glands are normal.

Thyroid: Normal.  No thyroid mass.

Lymph nodes: No enlarged or low-density nodes on either side of the
neck.

Vascular: Arterial and venous structures appear normal.

Limited intracranial: Normal

Visualized orbits: Normal

Mastoids and visualized paranasal sinuses: Clear

Skeleton: Ordinary mid cervical spondylosis. Congenital failure of
separation at T4 and T5.

Upper chest: Normal

Other: None
IMPRESSION: No abnormality seen to explain anterior neck pain. No sign of
neoplastic or inflammatory disease.

## 2019-05-29 ENCOUNTER — Other Ambulatory Visit: Payer: Self-pay | Admitting: Cardiology

## 2019-05-29 DIAGNOSIS — I209 Angina pectoris, unspecified: Secondary | ICD-10-CM

## 2019-07-02 DIAGNOSIS — R21 Rash and other nonspecific skin eruption: Secondary | ICD-10-CM | POA: Diagnosis not present

## 2019-07-02 DIAGNOSIS — R220 Localized swelling, mass and lump, head: Secondary | ICD-10-CM | POA: Diagnosis not present

## 2019-07-02 DIAGNOSIS — K2100 Gastro-esophageal reflux disease with esophagitis, without bleeding: Secondary | ICD-10-CM | POA: Diagnosis not present

## 2019-07-02 DIAGNOSIS — I10 Essential (primary) hypertension: Secondary | ICD-10-CM | POA: Diagnosis not present

## 2019-08-25 ENCOUNTER — Other Ambulatory Visit: Payer: Self-pay | Admitting: Family Medicine

## 2019-08-25 DIAGNOSIS — E039 Hypothyroidism, unspecified: Secondary | ICD-10-CM

## 2019-08-26 ENCOUNTER — Other Ambulatory Visit: Payer: Self-pay | Admitting: Cardiology

## 2019-08-26 ENCOUNTER — Other Ambulatory Visit: Payer: Self-pay

## 2019-08-26 DIAGNOSIS — I209 Angina pectoris, unspecified: Secondary | ICD-10-CM

## 2019-08-27 ENCOUNTER — Ambulatory Visit (INDEPENDENT_AMBULATORY_CARE_PROVIDER_SITE_OTHER): Payer: Federal, State, Local not specified - PPO | Admitting: Family Medicine

## 2019-08-27 ENCOUNTER — Encounter: Payer: Self-pay | Admitting: Family Medicine

## 2019-08-27 VITALS — BP 106/68 | HR 75 | Temp 98.6°F | Ht 61.0 in | Wt 163.6 lb

## 2019-08-27 DIAGNOSIS — E559 Vitamin D deficiency, unspecified: Secondary | ICD-10-CM | POA: Diagnosis not present

## 2019-08-27 DIAGNOSIS — Z0000 Encounter for general adult medical examination without abnormal findings: Secondary | ICD-10-CM

## 2019-08-27 DIAGNOSIS — E039 Hypothyroidism, unspecified: Secondary | ICD-10-CM | POA: Diagnosis not present

## 2019-08-27 NOTE — Progress Notes (Addendum)
Established Patient Office Visit  Subjective:  Patient ID: Maria Myers, female    DOB: 07-20-1965  Age: 54 y.o. MRN: [id number]  CC:  Chief Complaint  Patient presents with  . Annual Exam    CPE, patient would like to know if she can stop taking Metoprolol    HPI Maria Myers presents her annual check.   She has been doing well.  Continues follow-up with cardiology for her single-vessel LAD disease.  She wants to know if she can discontinue her metoprolol.  Would like to know what she can take for her springtime allergies.  Continues vitamin D with Os-Cal as well as high-dose weekly vitamin D.  Remains concerned about her wayward daughter who lives in [place].  Has not had a mammogram in 3 years but is otherwise up-to-date on her health maintenance. Same day again  Past Medical History:  Diagnosis Date  . Allergy   . Coronary artery disease    01/07/18 PCI/DES x1, normal EF via LV gram.   . Thyroid disease     Past Surgical History:  Procedure Laterality Date  . CESAREAN SECTION    . CORONARY STENT INTERVENTION N/A 01/07/2018   Procedure: CORONARY STENT INTERVENTION;  Surgeon: Kathleene Hazel, MD;  [facility];  Service: Cardiovascular;  Laterality: N/A;  . LEFT HEART CATH AND CORONARY ANGIOGRAPHY N/A 01/07/2018   Procedure: LEFT HEART CATH AND CORONARY ANGIOGRAPHY;  Surgeon: Kathleene Hazel, MD;  Location: [facility];  Service: Cardiovascular;  Laterality: N/A;    Family History  Problem Relation Age of Onset  . Arthritis Sister     Social History   Socioeconomic History  . Marital status: Married    Spouse name: Not on file  . Number of children: Not on file  . Years of education: Not on file  . Highest education level: Not on file  Occupational History  . Not on file  Tobacco Use  . Smoking status: Never Smoker  . Smokeless tobacco: Never Used  Substance and Sexual Activity  . Alcohol use: Never  . Drug use: No  . Sexual  activity: Not on file  Other Topics Concern  . Not on file  Social History Narrative  . Not on file   Social Determinants of Health   Financial Resource Strain:   . Difficulty of Paying Living Expenses:   Food Insecurity:   . Worried About Programme researcher, broadcasting/film/video in the Last Year:   . Barista in the Last Year:   Transportation Needs:   . Freight forwarder (Medical):   Marland Kitchen Lack of Transportation (Non-Medical):   Physical Activity:   . Days of Exercise per Week:   . Minutes of Exercise per Session:   Stress:   . Feeling of Stress :   Social Connections:   . Frequency of Communication with Friends and Family:   . Frequency of Social Gatherings with Friends and Family:   . Attends Religious Services:   . Active Member of Clubs or Organizations:   . Attends Banker Meetings:   Marland Kitchen Marital Status:   Intimate Partner Violence:   . Fear of Current or Ex-Partner:   . Emotionally Abused:   Marland Kitchen Physically Abused:   . Sexually Abused:     Outpatient Medications Prior to Visit  Medication Sig Dispense Refill  . albuterol (PROVENTIL HFA;VENTOLIN HFA) 108 (90 Base) MCG/ACT inhaler Inhale 2 puffs into the lungs every 4 (four)  hours as needed for wheezing or shortness of breath. 1 Inhaler 1  . aspirin EC 81 MG tablet Take 1 tablet (81 mg total) by mouth daily. 90 tablet 3  . atorvastatin (LIPITOR) 40 MG tablet TAKE 1 TABLET BY MOUTH EVERY DAY AT 6 PM 90 tablet 1  . fluticasone (FLONASE) 50 MCG/ACT nasal spray Place 2 sprays into both nostrils daily. 16 g 6  . levothyroxine (SYNTHROID) 75 MCG tablet TAKE 1 TABLET(75 MCG) BY MOUTH DAILY BEFORE BREAKFAST 90 tablet 1  . metoprolol succinate (TOPROL-XL) 25 MG 24 hr tablet Take 1 tablet (25 mg total) by mouth daily. Take with or immediately following a meal. 90 tablet 1  . Multiple Vitamin (MULTIVITAMIN WITH MINERALS) TABS tablet Take 1 tablet by mouth 4 (four) times a week.     Dellia Nims CALCIUM + D3 500-200 MG-UNIT TABS TAKE 1  TABLET BY MOUTH TWICE DAILY 180 tablet 3  . Vitamin D, Ergocalciferol, (DRISDOL) 50000 units CAPS capsule Take 1 capsule (50,000 Units total) by mouth every 7 (seven) days. (Patient taking differently: Take 50,000 Units by mouth every Sunday. ) 5 capsule 2  . nitroGLYCERIN (NITROSTAT) 0.4 MG SL tablet Place 0.4 mg under the tongue every 5 (five) minutes as needed for chest pain.    . ticagrelor (BRILINTA) 90 MG TABS tablet Take 1 tablet (90 mg total) by mouth 2 (two) times daily. NEED OFFICE VISIT FOR MORE REFILLS (Patient not taking: Reported on 08/27/2019) 60 tablet 0   No facility-administered medications prior to visit.    No Known Allergies  ROS Review of Systems  Constitutional: Negative for chills, diaphoresis, fatigue, fever and unexpected weight change.  HENT: Positive for congestion, rhinorrhea and sneezing.   Eyes: Negative for photophobia and visual disturbance.  Respiratory: Negative.   Cardiovascular: Negative.  Negative for chest pain, palpitations and leg swelling.  Gastrointestinal: Negative.   Endocrine: Negative for polyphagia and polyuria.  Genitourinary: Negative.   Musculoskeletal: Negative for gait problem and joint swelling.  Skin: Negative for pallor and rash.  Neurological: Negative for tremors and light-headedness.  Psychiatric/Behavioral: Negative.       Objective:    Physical Exam  Constitutional: She is oriented to person, place, and time. She appears well-developed and well-nourished. No distress.  HENT:  Head: Normocephalic and atraumatic.  Right Ear: External ear normal.  Left Ear: External ear normal.  Eyes: Conjunctivae are normal. Right eye exhibits no discharge. Left eye exhibits no discharge. No scleral icterus.  Neck: No JVD present. No tracheal deviation present. No thyromegaly present.  Cardiovascular: Normal heart sounds.  Pulses:      Carotid pulses are 2+ on the right side and 2+ on the left side. Pulmonary/Chest: Effort normal and  breath sounds normal. No stridor.  Abdominal: Soft.  Musculoskeletal:        General: No edema.     Cervical back: Neck supple.  Lymphadenopathy:    She has no cervical adenopathy.  Neurological: She is alert and oriented to person, place, and time.  Skin: Skin is warm and dry. She is not diaphoretic.  Psychiatric: She has a normal mood and affect. Her behavior is normal.    BP 106/68   Pulse 75   Temp 98.6 F (37 C) (Tympanic)   Ht 5\' 1"  (1.549 m)   Wt 163 lb 9.6 oz (74.2 kg)   SpO2 97%   BMI 30.91 kg/m  Wt Readings from Last 3 Encounters:  08/27/19 163 lb 9.6 oz (74.2 kg)  02/18/19 162 lb 6.4 oz (73.7 kg)  02/25/18 160 lb (72.6 kg)     Health Maintenance Due  Topic Date Due  . MAMMOGRAM  09/23/2017    There are no preventive care reminders to display for this patient.  Lab Results  Component Value Date   TSH 1.96 08/27/2019   Lab Results  Component Value Date   WBC 4.8 08/27/2019   HGB 13.0 08/27/2019   HCT 38.1 08/27/2019   MCV 85.1 08/27/2019   PLT 157.0 08/27/2019   Lab Results  Component Value Date   NA 139 08/27/2019   K 4.3 08/27/2019   CO2 30 08/27/2019   GLUCOSE 107 (H) 08/27/2019   BUN 12 08/27/2019   CREATININE 0.68 08/27/2019   BILITOT 0.3 08/27/2019   ALKPHOS 67 08/27/2019   AST 21 08/27/2019   ALT 21 08/27/2019   PROT 7.4 08/27/2019   ALBUMIN 4.2 08/27/2019   CALCIUM 9.1 08/27/2019   ANIONGAP 8 01/08/2018   GFR 90.13 08/27/2019   Lab Results  Component Value Date   CHOL 121 02/18/2019   Lab Results  Component Value Date   HDL 60 02/18/2019   Lab Results  Component Value Date   LDLCALC 42 02/18/2019   Lab Results  Component Value Date   TRIG 105 02/18/2019   Lab Results  Component Value Date   CHOLHDL 2.0 02/18/2019   No results found for: HGBA1C    Assessment & Plan:   Problem List Items Addressed This Visit      Endocrine   Acquired hypothyroidism - Primary   Relevant Orders   TSH (Completed)     Other    Vitamin D deficiency   Relevant Medications   ergocalciferol (VITAMIN D2) 1.25 MG (50000 UT) capsule   Other Relevant Orders   VITAMIN D 25 Hydroxy (Vit-D Deficiency, Fractures) (Completed)   Healthcare maintenance   Relevant Orders   CBC (Completed)   Comprehensive metabolic panel (Completed)   Urinalysis, Routine w reflex microscopic (Completed)   MM 3D SCREEN BREAST BILATERAL      Meds ordered this encounter  Medications  . ergocalciferol (VITAMIN D2) 1.25 MG (50000 UT) capsule    Sig: Take 1 capsule (50,000 Units total) by mouth once a week.    Dispense:  30 capsule    Refill:  1    Follow-up: Return in about 6 months (around 02/27/2020), or if symptoms worsen or fail to improve.  Patient was given information on health maintenance and disease prevention.  Advised her to continue metoprolol for its cardioprotective effect.  Mliss Sax, MD

## 2019-08-27 NOTE — Patient Instructions (Signed)
Health Maintenance for Postmenopausal Women Menopause is a normal process in which your ability to get pregnant comes to an end. This process happens slowly over many months or years, usually between the ages of 54 and 54. Menopause is complete when you have missed your menstrual periods for 12 months. It is important to talk with your health care provider about some of the most common conditions that affect women after menopause (postmenopausal women). These include heart disease, cancer, and bone loss (osteoporosis). Adopting a healthy lifestyle and getting preventive care can help to promote your health and wellness. The actions you take can also lower your chances of developing some of these common conditions. What should I know about menopause? During menopause, you may get a number of symptoms, such as:  Hot flashes. These can be moderate or severe.  Night sweats.  Decrease in sex drive.  Mood swings.  Headaches.  Tiredness.  Irritability.  Memory problems.  Insomnia. Choosing to treat or not to treat these symptoms is a decision that you make with your health care provider. Do I need hormone replacement therapy?  Hormone replacement therapy is effective in treating symptoms that are caused by menopause, such as hot flashes and night sweats.  Hormone replacement carries certain risks, especially as you become older. If you are thinking about using estrogen or estrogen with progestin, discuss the benefits and risks with your health care provider. What is my risk for heart disease and stroke? The risk of heart disease, heart attack, and stroke increases as you age. One of the causes may be a change in the body's hormones during menopause. This can affect how your body uses dietary fats, triglycerides, and cholesterol. Heart attack and stroke are medical emergencies. There are many things that you can do to help prevent heart disease and stroke. Watch your blood pressure  High  blood pressure causes heart disease and increases the risk of stroke. This is more likely to develop in people who have high blood pressure readings, are of African descent, or are overweight.  Have your blood pressure checked: ? Every 3-5 years if you are 54-54 years of age. ? Every year if you are 54 years old or older. Eat a healthy diet   Eat a diet that includes plenty of vegetables, fruits, low-fat dairy products, and lean protein.  Do not eat a lot of foods that are high in solid fats, added sugars, or sodium. Get regular exercise Get regular exercise. This is one of the most important things you can do for your health. Most adults should:  Try to exercise for at least 150 minutes each week. The exercise should increase your heart rate and make you sweat (moderate-intensity exercise).  Try to do strengthening exercises at least twice each week. Do these in addition to the moderate-intensity exercise.  Spend less time sitting. Even light physical activity can be beneficial. Other tips  Work with your health care provider to achieve or maintain a healthy weight.  Do not use any products that contain nicotine or tobacco, such as cigarettes, e-cigarettes, and chewing tobacco. If you need help quitting, ask your health care provider.  Know your numbers. Ask your health care provider to check your cholesterol and your blood sugar (glucose). Continue to have your blood tested as directed by your health care provider. Do I need screening for cancer? Depending on your health history and family history, you may need to have cancer screening at different stages of your life. This  may include screening for:  Breast cancer.  Cervical cancer.  Lung cancer.  Colorectal cancer. What is my risk for osteoporosis? After menopause, you may be at increased risk for osteoporosis. Osteoporosis is a condition in which bone destruction happens more quickly than new bone creation. To help prevent  osteoporosis or the bone fractures that can happen because of osteoporosis, you may take the following actions:  If you are 54-54 years old, get at least 1,000 mg of calcium and at least 600 mg of vitamin D per day.  If you are older than age 54 but younger than age 54, get at least 1,200 mg of calcium and at least 600 mg of vitamin D per day.  If you are older than age 54, get at least 1,200 mg of calcium and at least 800 mg of vitamin D per day. Smoking and drinking excessive alcohol increase the risk of osteoporosis. Eat foods that are rich in calcium and vitamin D, and do weight-bearing exercises several times each week as directed by your health care provider. How does menopause affect my mental health? Depression may occur at any age, but it is more common as you become older. Common symptoms of depression include:  Low or sad mood.  Changes in sleep patterns.  Changes in appetite or eating patterns.  Feeling an overall lack of motivation or enjoyment of activities that you previously enjoyed.  Frequent crying spells. Talk with your health care provider if you think that you are experiencing depression. General instructions See your health care provider for regular wellness exams and vaccines. This may include:  Scheduling regular health, dental, and eye exams.  Getting and maintaining your vaccines. These include: ? Influenza vaccine. Get this vaccine each year before the flu season begins. ? Pneumonia vaccine. ? Shingles vaccine. ? Tetanus, diphtheria, and pertussis (Tdap) booster vaccine. Your health care provider may also recommend other immunizations. Tell your health care provider if you have ever been abused or do not feel safe at home. Summary  Menopause is a normal process in which your ability to get pregnant comes to an end.  This condition causes hot flashes, night sweats, decreased interest in sex, mood swings, headaches, or lack of sleep.  Treatment for this  condition may include hormone replacement therapy.  Take actions to keep yourself healthy, including exercising regularly, eating a healthy diet, watching your weight, and checking your blood pressure and blood sugar levels.  Get screened for cancer and depression. Make sure that you are up to date with all your vaccines. This information is not intended to replace advice given to you by your health care provider. Make sure you discuss any questions you have with your health care provider. Document Revised: 05/15/2018 Document Reviewed: 05/15/2018 Elsevier Patient Education  2020 [employer]  Preventive Care 86-61 Years Old, Female Preventive care refers to visits with your health care provider and lifestyle choices that can promote health and wellness. This includes:  A yearly physical exam. This may also be called an annual well check.  Regular dental visits and eye exams.  Immunizations.  Screening for certain conditions.  Healthy lifestyle choices, such as eating a healthy diet, getting regular exercise, not using drugs or products that contain nicotine and tobacco, and limiting alcohol use. What can I expect for my preventive care visit? Physical exam Your health care provider will check your:  Height and weight. This may be used to calculate body mass index (BMI), which tells if you are  at a healthy weight.  Heart rate and blood pressure.  Skin for abnormal spots. Counseling Your health care provider may ask you questions about your:  Alcohol, tobacco, and drug use.  Emotional well-being.  Home and relationship well-being.  Sexual activity.  Eating habits.  Work and work Statistician.  Method of birth control.  Menstrual cycle.  Pregnancy history. What immunizations do I need?  Influenza (flu) vaccine  This is recommended every year. Tetanus, diphtheria, and pertussis (Tdap) vaccine  You may need a Td booster every 10 years. Varicella (chickenpox)  vaccine  You may need this if you have not been vaccinated. Zoster (shingles) vaccine  You may need this after age 30. Measles, mumps, and rubella (MMR) vaccine  You may need at least one dose of MMR if you were born in 1957 or later. You may also need a second dose. Pneumococcal conjugate (PCV13) vaccine  You may need this if you have certain conditions and were not previously vaccinated. Pneumococcal polysaccharide (PPSV23) vaccine  You may need one or two doses if you smoke cigarettes or if you have certain conditions. Meningococcal conjugate (MenACWY) vaccine  You may need this if you have certain conditions. Hepatitis A vaccine  You may need this if you have certain conditions or if you travel or work in places where you may be exposed to hepatitis A. Hepatitis B vaccine  You may need this if you have certain conditions or if you travel or work in places where you may be exposed to hepatitis B. Haemophilus influenzae type b (Hib) vaccine  You may need this if you have certain conditions. Human papillomavirus (HPV) vaccine  If recommended by your health care provider, you may need three doses over 6 months. You may receive vaccines as individual doses or as more than one vaccine together in one shot (combination vaccines). Talk with your health care provider about the risks and benefits of combination vaccines. What tests do I need? Blood tests  Lipid and cholesterol levels. These may be checked every 5 years, or more frequently if you are over 54 years old.  Hepatitis C test.  Hepatitis B test. Screening  Lung cancer screening. You may have this screening every year starting at age 68 if you have a 30-pack-year history of smoking and currently smoke or have quit within the past 15 years.  Colorectal cancer screening. All adults should have this screening starting at age 27 and continuing until age 32. Your health care provider may recommend screening at age 87 if you  are at increased risk. You will have tests every 1-10 years, depending on your results and the type of screening test.  Diabetes screening. This is done by checking your blood sugar (glucose) after you have not eaten for a while (fasting). You may have this done every 1-3 years.  Mammogram. This may be done every 1-2 years. Talk with your health care provider about when you should start having regular mammograms. This may depend on whether you have a family history of breast cancer.  BRCA-related cancer screening. This may be done if you have a family history of breast, ovarian, tubal, or peritoneal cancers.  Pelvic exam and Pap test. This may be done every 3 years starting at age 2. Starting at age 28, this may be done every 5 years if you have a Pap test in combination with an HPV test. Other tests  Sexually transmitted disease (STD) testing.  Bone density scan. This is done to screen  for osteoporosis. You may have this scan if you are at high risk for osteoporosis. Follow these instructions at home: Eating and drinking  Eat a diet that includes fresh fruits and vegetables, whole grains, lean protein, and low-fat dairy.  Take vitamin and mineral supplements as recommended by your health care provider.  Do not drink alcohol if: ? Your health care provider tells you not to drink. ? You are pregnant, may be pregnant, or are planning to become pregnant.  If you drink alcohol: ? Limit how much you have to 0-1 drink a day. ? Be aware of how much alcohol is in your drink. In the [place] one drink equals one 12 oz bottle of beer (355 mL), one 5 oz glass of wine (148 mL), or one 1 oz glass of hard liquor (44 mL). Lifestyle  Take daily care of your teeth and gums.  Stay active. Exercise for at least 30 minutes on 5 or more days each week.  Do not use any products that contain nicotine or tobacco, such as cigarettes, e-cigarettes, and chewing tobacco. If you need help quitting, ask your  health care provider.  If you are sexually active, practice safe sex. Use a condom or other form of birth control (contraception) in order to prevent pregnancy and STIs (sexually transmitted infections).  If told by your health care provider, take low-dose aspirin daily starting at age 68. What's next?  Visit your health care provider once a year for a well check visit.  Ask your health care provider how often you should have your eyes and teeth checked.  Stay up to date on all vaccines. This information is not intended to replace advice given to you by your health care provider. Make sure you discuss any questions you have with your health care provider. Document Revised: 01/31/2018 Document Reviewed: 01/31/2018 Elsevier Patient Education  2020 [employer].

## 2019-08-28 LAB — URINALYSIS, ROUTINE W REFLEX MICROSCOPIC
Bilirubin Urine: NEGATIVE
Ketones, ur: NEGATIVE
Leukocytes,Ua: NEGATIVE
Nitrite: NEGATIVE
Specific Gravity, Urine: <=1.005 — AB (ref 1.000–1.030)
Total Protein, Urine: NEGATIVE
Urine Glucose: NEGATIVE
Urobilinogen, UA: 0.2 (ref 0.0–1.0)
pH: 6 (ref 5.0–8.0)

## 2019-08-28 LAB — COMPREHENSIVE METABOLIC PANEL
ALT: 21 U/L (ref 0–35)
AST: 21 U/L (ref 0–37)
Albumin: 4.2 g/dL (ref 3.5–5.2)
Alkaline Phosphatase: 67 U/L (ref 39–117)
BUN: 12 mg/dL (ref 6–23)
CO2: 30 mEq/L (ref 19–32)
Calcium: 9.1 mg/dL (ref 8.4–10.5)
Chloride: 103 mEq/L (ref 96–112)
Creatinine, Ser: 0.68 mg/dL (ref 0.40–1.20)
GFR: 90.13 mL/min (ref >60.00)
Glucose, Bld: 107 mg/dL — ABNORMAL HIGH (ref 70–99)
Potassium: 4.3 mEq/L (ref 3.5–5.1)
Sodium: 139 mEq/L (ref 135–145)
Total Bilirubin: 0.3 mg/dL (ref 0.2–1.2)
Total Protein: 7.4 g/dL (ref 6.0–8.3)

## 2019-08-28 LAB — CBC
HCT: 38.1 % (ref 36.0–46.0)
Hemoglobin: 13 g/dL (ref 12.0–15.0)
MCHC: 34.1 g/dL (ref 30.0–36.0)
MCV: 85.1 fl (ref 78.0–100.0)
Platelets: 157 10*3/uL (ref 150.0–400.0)
RBC: 4.48 Mil/uL (ref 3.87–5.11)
RDW: 12.9 % (ref 11.5–15.5)
WBC: 4.8 10*3/uL (ref 4.0–10.5)

## 2019-08-28 LAB — VITAMIN D 25 HYDROXY (VIT D DEFICIENCY, FRACTURES): VITD: 25.25 ng/mL — ABNORMAL LOW (ref 30.00–100.00)

## 2019-08-28 LAB — TSH: TSH: 1.96 u[IU]/mL (ref 0.35–4.50)

## 2019-08-31 MED ORDER — ERGOCALCIFEROL 1.25 MG (50000 UT) PO CAPS: 50000.0000 [IU] | ORAL_CAPSULE | ORAL | 1 refills | Status: DC

## 2019-08-31 NOTE — Addendum Note (Signed)
Addended by: Andrez Grime on: 08/31/2019 07:52 PM   Modules accepted: Orders

## 2019-09-02 ENCOUNTER — Other Ambulatory Visit: Payer: Self-pay | Admitting: Cardiology

## 2019-11-11 ENCOUNTER — Other Ambulatory Visit: Payer: Self-pay | Admitting: Family Medicine

## 2019-11-11 DIAGNOSIS — E559 Vitamin D deficiency, unspecified: Secondary | ICD-10-CM

## 2019-11-24 ENCOUNTER — Encounter (HOSPITAL_BASED_OUTPATIENT_CLINIC_OR_DEPARTMENT_OTHER): Payer: Self-pay

## 2019-11-24 ENCOUNTER — Ambulatory Visit (HOSPITAL_BASED_OUTPATIENT_CLINIC_OR_DEPARTMENT_OTHER)
Admission: RE | Admit: 2019-11-24 | Discharge: 2019-11-24 | Disposition: A | Discharge status: Home / Self Care | Payer: Federal, State, Local not specified - PPO | Source: Ambulatory Visit | Attending: Family Medicine | Admitting: Family Medicine

## 2019-11-24 ENCOUNTER — Other Ambulatory Visit: Payer: Self-pay

## 2019-11-24 DIAGNOSIS — Z1231 Encounter for screening mammogram for malignant neoplasm of breast: Secondary | ICD-10-CM | POA: Diagnosis not present

## 2019-11-24 DIAGNOSIS — Z0000 Encounter for general adult medical examination without abnormal findings: Secondary | ICD-10-CM

## 2019-12-08 DIAGNOSIS — K080 Exfoliation of teeth due to systemic causes: Secondary | ICD-10-CM | POA: Diagnosis not present

## 2020-02-20 ENCOUNTER — Other Ambulatory Visit: Payer: Self-pay | Admitting: Family Medicine

## 2020-02-20 ENCOUNTER — Other Ambulatory Visit: Payer: Self-pay | Admitting: Cardiology

## 2020-02-20 DIAGNOSIS — E039 Hypothyroidism, unspecified: Secondary | ICD-10-CM

## 2020-02-20 DIAGNOSIS — I209 Angina pectoris, unspecified: Secondary | ICD-10-CM

## 2020-03-02 ENCOUNTER — Other Ambulatory Visit: Payer: Self-pay | Admitting: Cardiology

## 2020-05-22 ENCOUNTER — Other Ambulatory Visit: Payer: Self-pay | Admitting: Cardiology

## 2020-05-22 DIAGNOSIS — I209 Angina pectoris, unspecified: Secondary | ICD-10-CM

## 2020-05-24 ENCOUNTER — Telehealth: Payer: Self-pay

## 2020-05-24 DIAGNOSIS — I209 Angina pectoris, unspecified: Secondary | ICD-10-CM

## 2020-05-24 MED ORDER — ATORVASTATIN CALCIUM 40 MG PO TABS: ORAL_TABLET | ORAL | 0 refills | Status: DC

## 2020-05-24 NOTE — Telephone Encounter (Signed)
Refill sent to pharmacy.   

## 2020-08-19 ENCOUNTER — Other Ambulatory Visit: Payer: Self-pay | Admitting: Family Medicine

## 2020-08-19 DIAGNOSIS — E039 Hypothyroidism, unspecified: Secondary | ICD-10-CM

## 2020-08-29 ENCOUNTER — Other Ambulatory Visit: Payer: Self-pay | Admitting: Cardiology

## 2020-08-29 DIAGNOSIS — I209 Angina pectoris, unspecified: Secondary | ICD-10-CM

## 2020-08-30 NOTE — Telephone Encounter (Signed)
Refill sent to pharmacy.   

## 2020-09-09 ENCOUNTER — Other Ambulatory Visit: Payer: Self-pay | Admitting: Family Medicine

## 2020-09-09 ENCOUNTER — Other Ambulatory Visit: Payer: Self-pay | Admitting: Cardiology

## 2020-09-09 DIAGNOSIS — E559 Vitamin D deficiency, unspecified: Secondary | ICD-10-CM

## 2020-09-09 NOTE — Telephone Encounter (Signed)
Metoprolol Succinate 25 mg refill to pharmacy 

## 2020-09-11 ENCOUNTER — Other Ambulatory Visit: Payer: Self-pay | Admitting: Cardiology

## 2020-09-11 DIAGNOSIS — I209 Angina pectoris, unspecified: Secondary | ICD-10-CM

## 2020-09-13 ENCOUNTER — Other Ambulatory Visit: Payer: Self-pay

## 2020-09-13 DIAGNOSIS — I209 Angina pectoris, unspecified: Secondary | ICD-10-CM

## 2020-09-13 MED ORDER — ATORVASTATIN CALCIUM 40 MG PO TABS: ORAL_TABLET | ORAL | 0 refills | Status: DC

## 2020-09-13 NOTE — Telephone Encounter (Signed)
Atorvastatin 40 mg tablet # 15 only, patient to follow as scheduled 09/22/20  for future refills. Message to patient and pharmacy

## 2020-09-13 NOTE — Telephone Encounter (Signed)
Pharmacy notified patient will need to arrange an appt for further refills.

## 2020-09-20 DIAGNOSIS — T7840XA Allergy, unspecified, initial encounter: Secondary | ICD-10-CM | POA: Insufficient documentation

## 2020-09-22 ENCOUNTER — Encounter: Payer: Self-pay | Admitting: Cardiology

## 2020-09-22 ENCOUNTER — Other Ambulatory Visit: Payer: Self-pay

## 2020-09-22 ENCOUNTER — Ambulatory Visit: Payer: Federal, State, Local not specified - PPO | Admitting: Cardiology

## 2020-09-22 VITALS — BP 122/80 | HR 57 | Ht 61.5 in | Wt 161.0 lb

## 2020-09-22 DIAGNOSIS — I2510 Atherosclerotic heart disease of native coronary artery without angina pectoris: Secondary | ICD-10-CM | POA: Diagnosis not present

## 2020-09-22 DIAGNOSIS — E039 Hypothyroidism, unspecified: Secondary | ICD-10-CM | POA: Diagnosis not present

## 2020-09-22 DIAGNOSIS — E785 Hyperlipidemia, unspecified: Secondary | ICD-10-CM | POA: Diagnosis not present

## 2020-09-22 NOTE — Progress Notes (Signed)
Cardiology Office Note:    Date:  09/22/2020   ID:  Maria Myers, DOB Dec 31, 1965, MRN [id number]  PCP:  Mliss Sax, MD  Cardiologist:  Gypsy Balsam, MD    Referring MD: Mliss Sax,*   Chief Complaint  Patient presents with  . Follow-up  Am doing fine  History of Present Illness:    Maria Myers is a 55 y.o. female with past medical history significant for coronary artery disease, in 2019 she came to hospital because of chest pain, she was find to have completely occluded midportion of the left anterior descending artery.  She did have good collateralization in her left ventricle ejection fraction was normal.  She did receive stent into the midportion of left anterior descending artery.  The right coronary artery as well as circumflex arteries were normal.  She was also find to have dyslipidemia and treated appropriately. She comes today 2 months of follow-up overall she is doing very well she denies have any chest pain tightness squeezing pressure burning chest.  She is always very active she walks a lot she has no difficulty doing it she does complain of having some exertional shortness of breath.  Past Medical History:  Diagnosis Date  . Abnormal stress test 01/04/2018   Stress echo poor exercise tolerance only 3 minutes and LAD ischemia with chest pain  . Acquired hypothyroidism 09/06/2017  . Acute viral syndrome 10/25/2018  . Allergy   . Angina pectoris (HCC) 01/04/2018  . Annual physical exam 08/23/2015   Overview:  Last Assessment & Plan:  Counseled on health behaviors-encouraged to continue healthy diet, encouraged to add more exercise. Pap up-to-date, opts for mammogram screening every 2 years and this is up-to-date as well. Continue self breast exams monthly. Screening labs ordered. PIP joint pain-she is advised to try Voltaren as needed, if the pain worsens follow-up for further evaluation, no  . Anxiety 10/31/2017  . Chest pressure 07/23/2017  . Coronary artery  disease    01/07/18 PCI/DES x1, normal EF via LV gram.   . Dyslipidemia 02/18/2019  . Dysthymia 08/01/2017  . Environmental and seasonal allergies 08/12/2014   Overview:  Last Assessment & Plan:  She wants to resume allergy injections, records requested. Last Assessment & Plan:  She wants to resume allergy injections, records requested.  Formatting of this note might be different from the original. Last Assessment & Plan:  She wants to resume allergy injections, records requested. Last Assessment & Plan:  Formatting of this note might be different from t  . GERD (gastroesophageal reflux disease) 09/06/2017  . History of viral illness 11/04/2018  . Hypothyroidism 10/20/2011   Formatting of this note might be different from the original. Last Assessment & Plan:  She has not taken levothyroxine since I last saw her last year, she is using an [place] supplement for thyroid disease, she would like to resume levothyroxine if her thyroid panel is not in the reference range on the supplement. Last Assessment & Plan:  Formatting of this note might be different from the original  . Localized swelling, mass or lump of neck 08/01/2017  . Mass of neck 08/01/2017  . Seasonal allergic rhinitis due to pollen 07/23/2017  . Status post coronary artery stent placement   . Thyroid disease   . Unstable angina (HCC) 01/07/2018  . Vitamin D deficiency 09/06/2017    Past Surgical History:  Procedure Laterality Date  . CESAREAN SECTION    . CORONARY STENT INTERVENTION N/A 01/07/2018   Procedure:  CORONARY STENT INTERVENTION;  Surgeon: Kathleene Hazel, MD;  Location: [facility];  Service: Cardiovascular;  Laterality: N/A;  . LEFT HEART CATH AND CORONARY ANGIOGRAPHY N/A 01/07/2018   Procedure: LEFT HEART CATH AND CORONARY ANGIOGRAPHY;  Surgeon: Kathleene Hazel, MD;  Location: [facility];  Service: Cardiovascular;  Laterality: N/A;    Current Medications: Current Meds  Medication Sig  . albuterol  (PROVENTIL HFA;VENTOLIN HFA) 108 (90 Base) MCG/ACT inhaler Inhale 2 puffs into the lungs every 4 (four) hours as needed for wheezing or shortness of breath.  Marland Kitchen aspirin EC 81 MG tablet Take 1 tablet (81 mg total) by mouth daily.  Marland Kitchen atorvastatin (LIPITOR) 40 MG tablet PATIENT MUST KEEP SCHEDULED APPT 09/22/20 FOR FURTHER REFILLS (Patient taking differently: Take 40 mg by mouth daily. PATIENT MUST KEEP SCHEDULED APPT 09/22/20 FOR FURTHER REFILLS)  . fluticasone (FLONASE) 50 MCG/ACT nasal spray Place 2 sprays into both nostrils daily.  Marland Kitchen levothyroxine (SYNTHROID) 75 MCG tablet TAKE 1 TABLET(75 MCG) BY MOUTH DAILY BEFORE BREAKFAST (Patient taking differently: Take 75 mcg by mouth daily before breakfast. TAKE 1 TABLET(75 MCG) BY MOUTH DAILY BEFORE BREAKFAST)  . metoprolol succinate (TOPROL-XL) 25 MG 24 hr tablet TAKE ONE TABLET BY MOUTH DAILY. TAKE IMMEDIATLEY FOLLOWING A MEAL (Patient taking differently: Take 25 mg by mouth daily.)  . nitroGLYCERIN (NITROSTAT) 0.4 MG SL tablet Place 0.4 mg under the tongue every 5 (five) minutes as needed for chest pain.  . Vitamin D, Ergocalciferol, (DRISDOL) 1.25 MG (50000 UNIT) CAPS capsule TAKE 1 CAPSULE BY MOUTH ONCE A WEEK (Patient taking differently: Take 50,000 Units by mouth every 7 (seven) days.)     Allergies:   Patient has no known allergies.   Social History   Socioeconomic History  . Marital status: Married    Spouse name: Not on file  . Number of children: Not on file  . Years of education: Not on file  . Highest education level: Not on file  Occupational History  . Not on file  Tobacco Use  . Smoking status: Never Smoker  . Smokeless tobacco: Never Used  Vaping Use  . Vaping Use: Never used  Substance and Sexual Activity  . Alcohol use: Never  . Drug use: No  . Sexual activity: Not on file  Other Topics Concern  . Not on file  Social History Narrative  . Not on file   Social Determinants of Health   Financial Resource Strain: Not on  file  Food Insecurity: Not on file  Transportation Needs: Not on file  Physical Activity: Not on file  Stress: Not on file  Social Connections: Not on file     Family History: The patient's family history includes Arthritis in her sister. ROS:   Please see the history of present illness.    All 14 point review of systems negative except as described per history of present illness  EKGs/Labs/Other Studies Reviewed:      Recent Labs: No results found for requested labs within last 8760 hours.  Recent Lipid Panel    Component Value Date/Time   CHOL 121 02/18/2019 1201   TRIG 105 02/18/2019 1201   HDL 60 02/18/2019 1201   CHOLHDL 2.0 02/18/2019 1201   CHOLHDL 3 08/01/2017 1210   VLDL 29.4 08/01/2017 1210   LDLCALC 42 02/18/2019 1201    Physical Exam:    VS:  BP 122/80 (BP Location: Right Arm, Patient Position: Sitting)   Pulse (!) 57   Ht  5' 1.5" (1.562 m)   Wt 161 lb (73 kg)   SpO2 99%   BMI 29.93 kg/m     Wt Readings from Last 3 Encounters:  09/22/20 161 lb (73 kg)  08/27/19 163 lb 9.6 oz (74.2 kg)  02/18/19 162 lb 6.4 oz (73.7 kg)     GEN:  Well nourished, well developed in no acute distress HEENT: Normal NECK: No JVD; No carotid bruits LYMPHATICS: No lymphadenopathy CARDIAC: RRR, no murmurs, no rubs, no gallops RESPIRATORY:  Clear to auscultation without rales, wheezing or rhonchi  ABDOMEN: Soft, non-tender, non-distended MUSCULOSKELETAL:  No edema; No deformity  SKIN: Warm and dry LOWER EXTREMITIES: no swelling NEUROLOGIC:  Alert and oriented x 3 PSYCHIATRIC:  Normal affect   ASSESSMENT:    1. Coronary artery disease involving native coronary artery of native heart without angina pectoris   2. Acquired hypothyroidism   3. Dyslipidemia    PLAN:    In order of problems listed above:  1. Coronary disease doing well from that point review we will continue present management.  I will schedule her to have echocardiogram her EKG does have some  nonspecific T wave inversions.  That is in inferior leads.  On top of that she is asking about potentially discontinuation of beta-blocker again will check echocardiogram for ejection fraction if ejection fraction is diminished we will continue with beta-blockade. 2. Dyslipidemia we will check her fasting lipid profile today.  I did review her K PN which show me her LDL of 42 and an HDL of 60 this is from September 2020, she is on high intensity statin in form of Lipitor 40 which I will continue but will check her fasting lipid profile AST ALT.   Medication Adjustments/Labs and Tests Ordered: Current medicines are reviewed at length with the patient today.  Concerns regarding medicines are outlined above.  No orders of the defined types were placed in this encounter.  Medication changes: No orders of the defined types were placed in this encounter.   Signed, Georgeanna Lea, MD, [facility] 09/22/2020 1:35 PM     [facility]

## 2020-09-22 NOTE — Addendum Note (Signed)
Addended by: Hazle Quant on: 09/22/2020 01:41 PM   Modules accepted: Orders

## 2020-09-22 NOTE — Patient Instructions (Signed)
Medication Instructions:  Your physician recommends that you continue on your current medications as directed. Please refer to the Current Medication list given to you today.  *If you need a refill on your cardiac medications before your next appointment, please call your pharmacy*   Lab Work: Your physician recommends that you return for lab work today: lipid  If you have labs (blood work) drawn today and your tests are completely normal, you will receive your results only by: . MyChart Message (if you have MyChart) OR . A paper copy in the mail If you have any lab test that is abnormal or we need to change your treatment, we will call you to review the results.   Testing/Procedures: Your physician has requested that you have an echocardiogram. Echocardiography is a painless test that uses sound waves to create images of your heart. It provides your doctor with information about the size and shape of your heart and how well your heart's chambers and valves are working. This procedure takes approximately one hour. There are no restrictions for this procedure.     Follow-Up: At [facility], you and your health needs are our priority.  As part of our continuing mission to provide you with exceptional heart care, we have created designated Provider Care Teams.  These Care Teams include your primary Cardiologist (physician) and Advanced Practice Providers (APPs -  Physician Assistants and Nurse Practitioners) who all work together to provide you with the care you need, when you need it.  We recommend signing up for the patient portal called "MyChart".  Sign up information is provided on this After Visit Summary.  MyChart is used to connect with patients for Virtual Visits (Telemedicine).  Patients are able to view lab/test results, encounter notes, upcoming appointments, etc.  Non-urgent messages can be sent to your provider as well.   To learn more about what you can do with MyChart, go to  [email or web address].    Your next appointment:   6 month(s)  The format for your next appointment:   In Person  Provider:   Robert Krasowski, MD   Other Instructions   Echocardiogram An echocardiogram is a test that uses sound waves (ultrasound) to produce images of the heart. Images from an echocardiogram can provide important information about:  Heart size and shape.  The size and thickness and movement of your heart's walls.  Heart muscle function and strength.  Heart valve function or if you have stenosis. Stenosis is when the heart valves are too narrow.  If blood is flowing backward through the heart valves (regurgitation).  A tumor or infectious growth around the heart valves.  Areas of heart muscle that are not working well because of poor blood flow or injury from a heart attack.  Aneurysm detection. An aneurysm is a weak or damaged part of an artery wall. The wall bulges out from the normal force of blood pumping through the body. Tell a health care provider about:  Any allergies you have.  All medicines you are taking, including vitamins, herbs, eye drops, creams, and over-the-counter medicines.  Any blood disorders you have.  Any surgeries you have had.  Any medical conditions you have.  Whether you are pregnant or may be pregnant. What are the risks? Generally, this is a safe test. However, problems may occur, including an allergic reaction to dye (contrast) that may be used during the test. What happens before the test? No specific preparation is needed. You may eat and drink normally.   What happens during the test?  You will take off your clothes from the waist up and put on a hospital gown.  Electrodes or electrocardiogram (ECG)patches may be placed on your chest. The electrodes or patches are then connected to a device that monitors your heart rate and rhythm.  You will lie down on a table for an ultrasound exam. A gel will be applied to  your chest to help sound waves pass through your skin.  A handheld device, called a transducer, will be pressed against your chest and moved over your heart. The transducer produces sound waves that travel to your heart and bounce back (or "echo" back) to the transducer. These sound waves will be captured in real-time and changed into images of your heart that can be viewed on a video monitor. The images will be recorded on a computer and reviewed by your health care provider.  You may be asked to change positions or hold your breath for a short time. This makes it easier to get different views or better views of your heart.  In some cases, you may receive contrast through an IV in one of your veins. This can improve the quality of the pictures from your heart. The procedure may vary among health care providers and hospitals.   What can I expect after the test? You may return to your normal, everyday life, including diet, activities, and medicines, unless your health care provider tells you not to do that. Follow these instructions at home:  It is up to you to get the results of your test. Ask your health care provider, or the department that is doing the test, when your results will be ready.  Keep all follow-up visits. This is important. Summary  An echocardiogram is a test that uses sound waves (ultrasound) to produce images of the heart.  Images from an echocardiogram can provide important information about the size and shape of your heart, heart muscle function, heart valve function, and other possible heart problems.  You do not need to do anything to prepare before this test. You may eat and drink normally.  After the echocardiogram is completed, you may return to your normal, everyday life, unless your health care provider tells you not to do that. This information is not intended to replace advice given to you by your health care provider. Make sure you discuss any questions you have  with your health care provider. Document Revised: 01/13/2020 Document Reviewed: 01/13/2020 Elsevier Patient Education  2021 Elsevier Inc.   

## 2020-09-23 LAB — LIPID PANEL
Chol/HDL Ratio: 2.2 ratio (ref 0.0–4.4)
Cholesterol, Total: 116 mg/dL (ref 100–199)
HDL: 53 mg/dL (ref >39)
LDL Chol Calc (NIH): 41 mg/dL (ref 0–99)
Triglycerides: 128 mg/dL (ref 0–149)
VLDL Cholesterol Cal: 22 mg/dL (ref 5–40)

## 2020-09-23 NOTE — Addendum Note (Signed)
Addended by: Heywood Bene on: 09/23/2020 09:04 AM   Modules accepted: Orders

## 2020-09-24 ENCOUNTER — Telehealth: Payer: Self-pay | Admitting: Cardiology

## 2020-09-24 ENCOUNTER — Telehealth: Payer: Self-pay

## 2020-09-24 NOTE — Telephone Encounter (Signed)
-----   Message from Georgeanna Lea, MD sent at 09/24/2020 10:45 AM EDT ----- Cholesterol profile, stable, continue present management

## 2020-09-24 NOTE — Telephone Encounter (Signed)
Pt is returning call in regards to Lab results

## 2020-09-24 NOTE — Telephone Encounter (Signed)
Pt called in returning [facility], about her lab results   Best number [phone]

## 2020-09-24 NOTE — Telephone Encounter (Signed)
Patient notified of test results 

## 2020-10-17 ENCOUNTER — Other Ambulatory Visit: Payer: Self-pay | Admitting: Cardiology

## 2020-10-17 DIAGNOSIS — I209 Angina pectoris, unspecified: Secondary | ICD-10-CM

## 2020-10-18 ENCOUNTER — Other Ambulatory Visit: Payer: Self-pay

## 2020-10-18 ENCOUNTER — Encounter: Payer: Self-pay | Admitting: Family Medicine

## 2020-10-18 ENCOUNTER — Ambulatory Visit (INDEPENDENT_AMBULATORY_CARE_PROVIDER_SITE_OTHER): Payer: Federal, State, Local not specified - PPO | Admitting: Family Medicine

## 2020-10-18 VITALS — HR 72 | Temp 98.8°F | Ht 61.25 in | Wt 160.2 lb

## 2020-10-18 DIAGNOSIS — E559 Vitamin D deficiency, unspecified: Secondary | ICD-10-CM | POA: Diagnosis not present

## 2020-10-18 DIAGNOSIS — K1120 Sialoadenitis, unspecified: Secondary | ICD-10-CM | POA: Diagnosis not present

## 2020-10-18 DIAGNOSIS — E039 Hypothyroidism, unspecified: Secondary | ICD-10-CM | POA: Diagnosis not present

## 2020-10-18 DIAGNOSIS — Z0000 Encounter for general adult medical examination without abnormal findings: Secondary | ICD-10-CM

## 2020-10-18 DIAGNOSIS — Z0001 Encounter for general adult medical examination with abnormal findings: Secondary | ICD-10-CM

## 2020-10-18 LAB — URINALYSIS, ROUTINE W REFLEX MICROSCOPIC
Bilirubin Urine: NEGATIVE
Ketones, ur: NEGATIVE
Leukocytes,Ua: NEGATIVE
Nitrite: NEGATIVE
Specific Gravity, Urine: 1.01 (ref 1.000–1.030)
Total Protein, Urine: NEGATIVE
Urine Glucose: NEGATIVE
Urobilinogen, UA: 0.2 (ref 0.0–1.0)
pH: 7.5 (ref 5.0–8.0)

## 2020-10-18 LAB — VITAMIN D 25 HYDROXY (VIT D DEFICIENCY, FRACTURES): VITD: 21.34 ng/mL — ABNORMAL LOW (ref 30.00–100.00)

## 2020-10-18 LAB — CBC
HCT: 39.3 % (ref 36.0–46.0)
Hemoglobin: 13.3 g/dL (ref 12.0–15.0)
MCHC: 33.9 g/dL (ref 30.0–36.0)
MCV: 84.3 fl (ref 78.0–100.0)
Platelets: 187 10*3/uL (ref 150.0–400.0)
RBC: 4.66 Mil/uL (ref 3.87–5.11)
RDW: 13.3 % (ref 11.5–15.5)
WBC: 5.6 10*3/uL (ref 4.0–10.5)

## 2020-10-18 LAB — BASIC METABOLIC PANEL
BUN: 13 mg/dL (ref 6–23)
CO2: 27 mEq/L (ref 19–32)
Calcium: 8.9 mg/dL (ref 8.4–10.5)
Chloride: 102 mEq/L (ref 96–112)
Creatinine, Ser: 0.65 mg/dL (ref 0.40–1.20)
GFR: 99.23 mL/min (ref >60.00)
Glucose, Bld: 86 mg/dL (ref 70–99)
Potassium: 4 mEq/L (ref 3.5–5.1)
Sodium: 139 mEq/L (ref 135–145)

## 2020-10-18 LAB — HEMOGLOBIN A1C: Hgb A1c MFr Bld: 6.1 % (ref 4.6–6.5)

## 2020-10-18 LAB — HEPATIC FUNCTION PANEL
ALT: 23 U/L (ref 0–35)
AST: 21 U/L (ref 0–37)
Albumin: 4.3 g/dL (ref 3.5–5.2)
Alkaline Phosphatase: 89 U/L (ref 39–117)
Bilirubin, Direct: 0.1 mg/dL (ref 0.0–0.3)
Total Bilirubin: 0.4 mg/dL (ref 0.2–1.2)
Total Protein: 7.3 g/dL (ref 6.0–8.3)

## 2020-10-18 LAB — TSH: TSH: 0.95 u[IU]/mL (ref 0.35–4.50)

## 2020-10-18 MED ORDER — CEPHALEXIN 500 MG PO CAPS: 500.0000 mg | ORAL_CAPSULE | Freq: Three times a day (TID) | ORAL | 0 refills | Status: AC

## 2020-10-18 NOTE — Telephone Encounter (Signed)
Refill sent to pharmacy.   

## 2020-10-18 NOTE — Progress Notes (Signed)
Established Patient Office Visit  Subjective:  Patient ID: Maria Myers, female    DOB: 1965/12/22  Age: 55 y.o. MRN: [id number]  CC:  Chief Complaint  Patient presents with  . Annual Exam    Pt not fasting today for lab work.  Pt received her booster yesterday.  Pt not UPT on pap, x 2years or mammogram, and Tdap.    HPI Maria Myers presents for yearly physical.  Lipid profile recently obtained looks great.  She has not needed to use her nitroglycerin ever.  Blood pressures well controlled.  She has no chest pain or shortness of breath.  Patient is due for her Pap smear and mammogram.  She is planning on seeing GYN doctor.  She is compliant with her levothyroxine and weekly high-dose ergocalciferol.  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  She is also having a tender swelling below her right mandible.  Past Medical History:  Diagnosis Date  . Abnormal stress test 01/04/2018   Stress echo poor exercise tolerance only 3 minutes and LAD ischemia with chest pain  . Acquired hypothyroidism 09/06/2017  . Acute viral syndrome 10/25/2018  . Allergy   . Angina pectoris (HCC) 01/04/2018  . Annual physical exam 08/23/2015   Overview:  Last Assessment & Plan:  Counseled on health behaviors-encouraged to continue healthy diet, encouraged to add more exercise. Pap up-to-date, opts for mammogram screening every 2 years and this is up-to-date as well. Continue self breast exams monthly. Screening labs ordered. PIP joint pain-she is advised to try Voltaren as needed, if the pain worsens follow-up for further evaluation, no  . Anxiety 10/31/2017  . Chest pressure 07/23/2017  . Coronary artery disease    01/07/18 PCI/DES x1, normal EF via LV gram.   . Dyslipidemia 02/18/2019  . Dysthymia 08/01/2017  . Environmental and seasonal allergies 08/12/2014   Overview:  Last Assessment & Plan:  She wants to resume allergy injections, records requested. Last Assessment & Plan:  She wants to resume allergy injections, records  requested.  Formatting of this note might be different from the original. Last Assessment & Plan:  She wants to resume allergy injections, records requested. Last Assessment & Plan:  Formatting of this note might be different from t  . GERD (gastroesophageal reflux disease) 09/06/2017  . History of viral illness 11/04/2018  . Hypothyroidism 10/20/2011   Formatting of this note might be different from the original. Last Assessment & Plan:  She has not taken levothyroxine since I last saw her last year, she is using an [place] supplement for thyroid disease, she would like to resume levothyroxine if her thyroid panel is not in the reference range on the supplement. Last Assessment & Plan:  Formatting of this note might be different from the original  . Localized swelling, mass or lump of neck 08/01/2017  . Mass of neck 08/01/2017  . Seasonal allergic rhinitis due to pollen 07/23/2017  . Status post coronary artery stent placement   . Thyroid disease   . Unstable angina (HCC) 01/07/2018  . Vitamin D deficiency 09/06/2017    Past Surgical History:  Procedure Laterality Date  . CESAREAN SECTION    . CORONARY STENT INTERVENTION N/A 01/07/2018   Procedure: CORONARY STENT INTERVENTION;  Surgeon: Kathleene Hazel, MD;  Location: [facility];  Service: Cardiovascular;  Laterality: N/A;  . LEFT HEART CATH AND CORONARY ANGIOGRAPHY N/A 01/07/2018   Procedure: LEFT HEART CATH AND CORONARY ANGIOGRAPHY;  Surgeon: Kathleene Hazel, MD;  Location: [facility]AS[facility] CV [facility];  Service: Cardiovascular;  Laterality: N/A;    Family History  Problem Relation Age of Onset  . Arthritis Sister     Social History   Socioeconomic History  . Marital status: Married    Spouse name: Not on file  . Number of children: Not on file  . Years of education: Not on file  . Highest education level: Not on file  Occupational History  . Not on file  Tobacco Use  . Smoking status: Never Smoker  . Smokeless tobacco: Never  Used  Vaping Use  . Vaping Use: Never used  Substance and Sexual Activity  . Alcohol use: Never  . Drug use: No  . Sexual activity: Not on file  Other Topics Concern  . Not on file  Social History Narrative  . Not on file   Social Determinants of Health   Financial Resource Strain: Not on file  Food Insecurity: Not on file  Transportation Needs: Not on file  Physical Activity: Not on file  Stress: Not on file  Social Connections: Not on file  Intimate Partner Violence: Not on file    Outpatient Medications Prior to Visit  Medication Sig Dispense Refill  . albuterol (PROVENTIL HFA;VENTOLIN HFA) 108 (90 Base) MCG/ACT inhaler Inhale 2 puffs into the lungs every 4 (four) hours as needed for wheezing or shortness of breath. 1 Inhaler 1  . aspirin EC 81 MG tablet Take 1 tablet (81 mg total) by mouth daily. 90 tablet 3  . atorvastatin (LIPITOR) 40 MG tablet TAKE 1 TABLET BY MOUTH EVERY DAY 90 tablet 3  . fluticasone (FLONASE) 50 MCG/ACT nasal spray Place 2 sprays into both nostrils daily. 16 g 6  . levothyroxine (SYNTHROID) 75 MCG tablet TAKE 1 TABLET(75 MCG) BY MOUTH DAILY BEFORE BREAKFAST (Patient taking differently: Take 75 mcg by mouth daily before breakfast. TAKE 1 TABLET(75 MCG) BY MOUTH DAILY BEFORE BREAKFAST) 90 tablet 1  . metoprolol succinate (TOPROL-XL) 25 MG 24 hr tablet TAKE ONE TABLET BY MOUTH DAILY. TAKE IMMEDIATLEY FOLLOWING A MEAL (Patient taking differently: Take 25 mg by mouth daily.) 90 tablet 3  . nitroGLYCERIN (NITROSTAT) 0.4 MG SL tablet Place 0.4 mg under the tongue every 5 (five) minutes as needed for chest pain.    . Vitamin D, Ergocalciferol, (DRISDOL) 1.25 MG (50000 UNIT) CAPS capsule TAKE 1 CAPSULE BY MOUTH ONCE A WEEK (Patient taking differently: Take 50,000 Units by mouth every 7 (seven) days.) 7 capsule 0  . atorvastatin (LIPITOR) 40 MG tablet PATIENT MUST KEEP SCHEDULED APPT 09/22/20 FOR FURTHER REFILLS (Patient taking differently: Take 40 mg by mouth daily.  PATIENT MUST KEEP SCHEDULED APPT 09/22/20 FOR FURTHER REFILLS) 15 tablet 0   No facility-administered medications prior to visit.    No Known Allergies  ROS Review of Systems  Constitutional: Negative.   HENT: Negative.   Eyes: Negative for photophobia and visual disturbance.  Respiratory: Negative.   Cardiovascular: Negative.   Gastrointestinal: Negative.   Endocrine: Negative for cold intolerance and heat intolerance.  Genitourinary: Negative for difficulty urinating, flank pain and frequency.  Musculoskeletal: Negative for gait problem and joint swelling.  Skin: Negative for pallor and rash.  Neurological: Negative for weakness and numbness.  Psychiatric/Behavioral: Negative.    Depression screen [facility] 2/9 10/18/2020 08/27/2019 10/31/2017  Decreased Interest 0 0 0  Down, Depressed, Hopeless 0 0 0  PHQ - 2 Score 0 0 0  Altered sleeping - 0 0  Tired, decreased energy - 0  0  Change in appetite - 2 2  Feeling bad or failure about yourself  - 0 1  Trouble concentrating - 0 1  Moving slowly or fidgety/restless - 0 0  Suicidal thoughts - 0 0  PHQ-9 Score - 2 4      Objective:    Physical Exam Vitals and nursing note reviewed.  Constitutional:      General: She is not in acute distress.    Appearance: Normal appearance. She is normal weight. She is not ill-appearing, toxic-appearing or diaphoretic.  HENT:     Head: Normocephalic and atraumatic.     Right Ear: Tympanic membrane, ear canal and external ear normal.     Left Ear: Tympanic membrane, ear canal and external ear normal.     Mouth/Throat:     Mouth: Mucous membranes are moist.     Pharynx: Oropharynx is clear. No oropharyngeal exudate or posterior oropharyngeal erythema.  Eyes:     General: No scleral icterus.       Right eye: No discharge.        Left eye: No discharge.     Extraocular Movements: Extraocular movements intact.     Conjunctiva/sclera: Conjunctivae normal.     Pupils: Pupils are equal, round, and  reactive to light.  Neck:     Vascular: No carotid bruit.   Cardiovascular:     Rate and Rhythm: Normal rate and regular rhythm.  Pulmonary:     Effort: Pulmonary effort is normal.     Breath sounds: Normal breath sounds.  Abdominal:     General: Bowel sounds are normal.  Musculoskeletal:     Cervical back: No rigidity or tenderness.     Right lower leg: No edema.     Left lower leg: No edema.  Lymphadenopathy:     Cervical: No cervical adenopathy.  Skin:    General: Skin is warm and dry.  Neurological:     Mental Status: She is alert and oriented to person, place, and time.  Psychiatric:        Mood and Affect: Mood normal.        Behavior: Behavior normal.     Pulse 72   Temp 98.8 F (37.1 C) (Oral)   Ht 5' 1.25" (1.556 m)   Wt 160 lb 3.2 oz (72.7 kg)   SpO2 98%   BMI 30.02 kg/m  Wt Readings from Last 3 Encounters:  10/18/20 160 lb 3.2 oz (72.7 kg)  09/22/20 161 lb (73 kg)  08/27/19 163 lb 9.6 oz (74.2 kg)     Health Maintenance Due  Topic Date Due  . Hepatitis C Screening  Never done  . TETANUS/TDAP  Never done  . PAP SMEAR-Modifier  06/05/2020    There are no preventive care reminders to display for this patient.  Lab Results  Component Value Date   TSH 1.96 08/27/2019   Lab Results  Component Value Date   WBC 4.8 08/27/2019   HGB 13.0 08/27/2019   HCT 38.1 08/27/2019   MCV 85.1 08/27/2019   PLT 157.0 08/27/2019   Lab Results  Component Value Date   NA 139 08/27/2019   K 4.3 08/27/2019   CO2 30 08/27/2019   GLUCOSE 107 (H) 08/27/2019   BUN 12 08/27/2019   CREATININE 0.68 08/27/2019   BILITOT 0.3 08/27/2019   ALKPHOS 67 08/27/2019   AST 21 08/27/2019   ALT 21 08/27/2019   PROT 7.4 08/27/2019   ALBUMIN 4.2 08/27/2019   CALCIUM 9.1  08/27/2019   ANIONGAP 8 01/08/2018   GFR 90.13 08/27/2019   Lab Results  Component Value Date   CHOL 116 09/22/2020   Lab Results  Component Value Date   HDL 53 09/22/2020   Lab Results  Component  Value Date   LDLCALC 41 09/22/2020   Lab Results  Component Value Date   TRIG 128 09/22/2020   Lab Results  Component Value Date   CHOLHDL 2.2 09/22/2020   No results found for: HGBA1C    Assessment & Plan:   Problem List Items Addressed This Visit      Digestive   Sialoadenitis of submandibular gland   Relevant Medications   cephALEXin (KEFLEX) 500 MG capsule     Endocrine   Acquired hypothyroidism - Primary   Relevant Orders   TSH     Other   Vitamin D deficiency   Relevant Orders   VITAMIN D 25 Hydroxy (Vit-D Deficiency, Fractures)   Healthcare maintenance   Relevant Orders   Basic metabolic panel   CBC   Hepatic function panel   Hemoglobin A1c   Urinalysis, Routine w reflex microscopic   Ambulatory referral to Gynecology   MM Digital Screening      Meds ordered this encounter  Medications  . cephALEXin (KEFLEX) 500 MG capsule    Sig: Take 1 capsule (500 mg total) by mouth 3 (three) times daily for 10 days.    Dispense:  30 capsule    Refill:  0    Follow-up: Return in about 6 months (around 04/20/2021).   Given information on health maintenance and disease prevention.  Patient agrees to go for Pap smear and mammogram.  Colonoscopy is up-to-date.  Levothyroxine dose adjusted pending results of TSH.  We will follow.  Tender swelling in the right sub mandibular.  Does not resolve with cephalexin.  Consider ENT referral. Mliss SaxWilliam Alfred Aldea Avis, MD

## 2020-10-18 NOTE — Patient Instructions (Addendum)
Health Maintenance Due  Topic Date Due  . COVID-19 Vaccine (1) Never done  . Hepatitis C Screening  Never done  . TETANUS/TDAP  Never done  . PAP SMEAR-Modifier  06/05/2020    Depression screen [facility] 2/9 08/27/2019 10/31/2017 08/01/2017  Decreased Interest 0 0 -  Down, Depressed, Hopeless 0 0 3  PHQ - 2 Score 0 0 3  Altered sleeping 0 0 0  Tired, decreased energy 0 0 2  Change in appetite 2 2 3   Feeling bad or failure about yourself  0 1 1  Trouble concentrating 0 1 0  Moving slowly or fidgety/restless 0 0 1  Suicidal thoughts 0 0 0  PHQ-9 Score 2 4 10     Health Maintenance, Female Adopting a healthy lifestyle and getting preventive care are important in promoting health and wellness. Ask your health care provider about:  The right schedule for you to have regular tests and exams.  Things you can do on your own to prevent diseases and keep yourself healthy. What should I know about diet, weight, and exercise? Eat a healthy diet  Eat a diet that includes plenty of vegetables, fruits, low-fat dairy products, and lean protein.  Do not eat a lot of foods that are high in solid fats, added sugars, or sodium.   Maintain a healthy weight Body mass index (BMI) is used to identify weight problems. It estimates body fat based on height and weight. Your health care provider can help determine your BMI and help you achieve or maintain a healthy weight. Get regular exercise Get regular exercise. This is one of the most important things you can do for your health. Most adults should:  Exercise for at least 150 minutes each week. The exercise should increase your heart rate and make you sweat (moderate-intensity exercise).  Do strengthening exercises at least twice a week. This is in addition to the moderate-intensity exercise.  Spend less time sitting. Even light physical activity can be beneficial. Watch cholesterol and blood lipids Have your blood tested for lipids and cholesterol at 55  years of age, then have this test every 5 years. Have your cholesterol levels checked more often if:  Your lipid or cholesterol levels are high.  You are older than 55 years of age.  You are at high risk for heart disease. What should I know about cancer screening? Depending on your health history and family history, you may need to have cancer screening at various ages. This may include screening for:  Breast cancer.  Cervical cancer.  Colorectal cancer.  Skin cancer.  Lung cancer. What should I know about heart disease, diabetes, and high blood pressure? Blood pressure and heart disease  High blood pressure causes heart disease and increases the risk of stroke. This is more likely to develop in people who have high blood pressure readings, are of African descent, or are overweight.  Have your blood pressure checked: ? Every 3-5 years if you are 14-25 years of age. ? Every year if you are 55 years old or older. Diabetes Have regular diabetes screenings. This checks your fasting blood sugar level. Have the screening done:  Once every three years after age 55 if you are at a normal weight and have a low risk for diabetes.  More often and at a younger age if you are overweight or have a high risk for diabetes. What should I know about preventing infection? Hepatitis B If you have a higher risk for hepatitis B, you  should be screened for this virus. Talk with your health care provider to find out if you are at risk for hepatitis B infection. Hepatitis C Testing is recommended for:  Everyone born from [birth year] through 1965.  Anyone with known risk factors for hepatitis C. Sexually transmitted infections (STIs)  Get screened for STIs, including gonorrhea and chlamydia, if: ? You are sexually active and are younger than 55 years of age. ? You are older than 55 years of age and your health care provider tells you that you are at risk for this type of infection. ? Your sexual  activity has changed since you were last screened, and you are at increased risk for chlamydia or gonorrhea. Ask your health care provider if you are at risk.  Ask your health care provider about whether you are at high risk for HIV. Your health care provider may recommend a prescription medicine to help prevent HIV infection. If you choose to take medicine to prevent HIV, you should first get tested for HIV. You should then be tested every 3 months for as long as you are taking the medicine. Pregnancy  If you are about to stop having your period (premenopausal) and you may become pregnant, seek counseling before you get pregnant.  Take 400 to 800 micrograms (mcg) of folic acid every day if you become pregnant.  Ask for birth control (contraception) if you want to prevent pregnancy. Osteoporosis and menopause Osteoporosis is a disease in which the bones lose minerals and strength with aging. This can result in bone fractures. If you are 18 years old or older, or if you are at risk for osteoporosis and fractures, ask your health care provider if you should:  Be screened for bone loss.  Take a calcium or vitamin D supplement to lower your risk of fractures.  Be given hormone replacement therapy (HRT) to treat symptoms of menopause. Follow these instructions at home: Lifestyle  Do not use any products that contain nicotine or tobacco, such as cigarettes, e-cigarettes, and chewing tobacco. If you need help quitting, ask your health care provider.  Do not use street drugs.  Do not share needles.  Ask your health care provider for help if you need support or information about quitting drugs. Alcohol use  Do not drink alcohol if: ? Your health care provider tells you not to drink. ? You are pregnant, may be pregnant, or are planning to become pregnant.  If you drink alcohol: ? Limit how much you use to 0-1 drink a day. ? Limit intake if you are breastfeeding.  Be aware of how much  alcohol is in your drink. In the [place] one drink equals one 12 oz bottle of beer (355 mL), one 5 oz glass of wine (148 mL), or one 1 oz glass of hard liquor (44 mL). General instructions  Schedule regular health, dental, and eye exams.  Stay current with your vaccines.  Tell your health care provider if: ? You often feel depressed. ? You have ever been abused or do not feel safe at home. Summary  Adopting a healthy lifestyle and getting preventive care are important in promoting health and wellness.  Follow your health care provider's instructions about healthy diet, exercising, and getting tested or screened for diseases.  Follow your health care provider's instructions on monitoring your cholesterol and blood pressure. This information is not intended to replace advice given to you by your health care provider. Make sure you discuss any questions you have with  your health care provider. Document Revised: 05/15/2018 Document Reviewed: 05/15/2018 Elsevier Patient Education  2021 Elsevier Inc.  Health Maintenance for Postmenopausal Women Menopause is a normal process in which your ability to get pregnant comes to an end. This process happens slowly over many months or years, usually between the ages of 248 and 7555. Menopause is complete when you have missed your menstrual periods for 12 months. It is important to talk with your health care provider about some of the most common conditions that affect women after menopause (postmenopausal women). These include heart disease, cancer, and bone loss (osteoporosis). Adopting a healthy lifestyle and getting preventive care can help to promote your health and wellness. The actions you take can also lower your chances of developing some of these common conditions. What should I know about menopause? During menopause, you may get a number of symptoms, such as:  Hot flashes. These can be moderate or severe.  Night sweats.  Decrease in sex  drive.  Mood swings.  Headaches.  Tiredness.  Irritability.  Memory problems.  Insomnia. Choosing to treat or not to treat these symptoms is a decision that you make with your health care provider. Do I need hormone replacement therapy?  Hormone replacement therapy is effective in treating symptoms that are caused by menopause, such as hot flashes and night sweats.  Hormone replacement carries certain risks, especially as you become older. If you are thinking about using estrogen or estrogen with progestin, discuss the benefits and risks with your health care provider. What is my risk for heart disease and stroke? The risk of heart disease, heart attack, and stroke increases as you age. One of the causes may be a change in the body's hormones during menopause. This can affect how your body uses dietary fats, triglycerides, and cholesterol. Heart attack and stroke are medical emergencies. There are many things that you can do to help prevent heart disease and stroke. Watch your blood pressure  High blood pressure causes heart disease and increases the risk of stroke. This is more likely to develop in people who have high blood pressure readings, are of African descent, or are overweight.  Have your blood pressure checked: ? Every 3-5 years if you are 3918-55 years of age. ? Every year if you are 55 years old or older. Eat a healthy diet  Eat a diet that includes plenty of vegetables, fruits, low-fat dairy products, and lean protein.  Do not eat a lot of foods that are high in solid fats, added sugars, or sodium.   Get regular exercise Get regular exercise. This is one of the most important things you can do for your health. Most adults should:  Try to exercise for at least 150 minutes each week. The exercise should increase your heart rate and make you sweat (moderate-intensity exercise).  Try to do strengthening exercises at least twice each week. Do these in addition to the  moderate-intensity exercise.  Spend less time sitting. Even light physical activity can be beneficial. Other tips  Work with your health care provider to achieve or maintain a healthy weight.  Do not use any products that contain nicotine or tobacco, such as cigarettes, e-cigarettes, and chewing tobacco. If you need help quitting, ask your health care provider.  Know your numbers. Ask your health care provider to check your cholesterol and your blood sugar (glucose). Continue to have your blood tested as directed by your health care provider. Do I need screening for cancer? Depending on  your health history and family history, you may need to have cancer screening at different stages of your life. This may include screening for:  Breast cancer.  Cervical cancer.  Lung cancer.  Colorectal cancer. What is my risk for osteoporosis? After menopause, you may be at increased risk for osteoporosis. Osteoporosis is a condition in which bone destruction happens more quickly than new bone creation. To help prevent osteoporosis or the bone fractures that can happen because of osteoporosis, you may take the following actions:  If you are 85-76 years old, get at least 1,000 mg of calcium and at least 600 mg of vitamin D per day.  If you are older than age 33 but younger than age 39, get at least 1,200 mg of calcium and at least 600 mg of vitamin D per day.  If you are older than age 18, get at least 1,200 mg of calcium and at least 800 mg of vitamin D per day. Smoking and drinking excessive alcohol increase the risk of osteoporosis. Eat foods that are rich in calcium and vitamin D, and do weight-bearing exercises several times each week as directed by your health care provider. How does menopause affect my mental health? Depression may occur at any age, but it is more common as you become older. Common symptoms of depression include:  Low or sad mood.  Changes in sleep patterns.  Changes in  appetite or eating patterns.  Feeling an overall lack of motivation or enjoyment of activities that you previously enjoyed.  Frequent crying spells. Talk with your health care provider if you think that you are experiencing depression. General instructions See your health care provider for regular wellness exams and vaccines. This may include:  Scheduling regular health, dental, and eye exams.  Getting and maintaining your vaccines. These include: ? Influenza vaccine. Get this vaccine each year before the flu season begins. ? Pneumonia vaccine. ? Shingles vaccine. ? Tetanus, diphtheria, and pertussis (Tdap) booster vaccine. Your health care provider may also recommend other immunizations. Tell your health care provider if you have ever been abused or do not feel safe at home. Summary  Menopause is a normal process in which your ability to get pregnant comes to an end.  This condition causes hot flashes, night sweats, decreased interest in sex, mood swings, headaches, or lack of sleep.  Treatment for this condition may include hormone replacement therapy.  Take actions to keep yourself healthy, including exercising regularly, eating a healthy diet, watching your weight, and checking your blood pressure and blood sugar levels.  Get screened for cancer and depression. Make sure that you are up to date with all your vaccines. This information is not intended to replace advice given to you by your health care provider. Make sure you discuss any questions you have with your health care provider. Document Revised: 05/15/2018 Document Reviewed: 05/15/2018 Elsevier Patient Education  2021 [employer].

## 2020-10-25 ENCOUNTER — Other Ambulatory Visit: Payer: Self-pay

## 2020-10-25 ENCOUNTER — Ambulatory Visit (HOSPITAL_COMMUNITY): Payer: Federal, State, Local not specified - PPO | Attending: Cardiovascular Disease

## 2020-10-25 DIAGNOSIS — I2510 Atherosclerotic heart disease of native coronary artery without angina pectoris: Secondary | ICD-10-CM | POA: Insufficient documentation

## 2020-10-25 LAB — ECHOCARDIOGRAM COMPLETE
Area-P 1/2: 5.79 cm2
S' Lateral: 2.3 cm

## 2020-11-22 ENCOUNTER — Other Ambulatory Visit: Payer: Self-pay | Admitting: Family Medicine

## 2020-11-22 DIAGNOSIS — E039 Hypothyroidism, unspecified: Secondary | ICD-10-CM

## 2021-01-03 ENCOUNTER — Ambulatory Visit
Admission: RE | Admit: 2021-01-03 | Discharge: 2021-01-03 | Disposition: A | Discharge status: Home / Self Care | Payer: Federal, State, Local not specified - PPO | Source: Ambulatory Visit | Attending: Family Medicine | Admitting: Family Medicine

## 2021-01-03 ENCOUNTER — Other Ambulatory Visit: Payer: Self-pay

## 2021-01-03 DIAGNOSIS — Z0000 Encounter for general adult medical examination without abnormal findings: Secondary | ICD-10-CM

## 2021-01-03 DIAGNOSIS — Z1231 Encounter for screening mammogram for malignant neoplasm of breast: Secondary | ICD-10-CM | POA: Diagnosis not present

## 2021-03-21 ENCOUNTER — Ambulatory Visit (INDEPENDENT_AMBULATORY_CARE_PROVIDER_SITE_OTHER): Payer: Federal, State, Local not specified - PPO | Admitting: Obstetrics & Gynecology

## 2021-03-21 ENCOUNTER — Encounter: Payer: Self-pay | Admitting: Obstetrics & Gynecology

## 2021-03-21 ENCOUNTER — Other Ambulatory Visit: Payer: Self-pay

## 2021-03-21 ENCOUNTER — Other Ambulatory Visit (HOSPITAL_COMMUNITY)
Admission: RE | Admit: 2021-03-21 | Discharge: 2021-03-21 | Disposition: A | Discharge status: Home / Self Care | Payer: Federal, State, Local not specified - PPO | Source: Ambulatory Visit | Attending: Obstetrics & Gynecology | Admitting: Obstetrics & Gynecology

## 2021-03-21 VITALS — BP 130/80 | HR 61 | Ht 62.0 in | Wt 157.0 lb

## 2021-03-21 DIAGNOSIS — Z124 Encounter for screening for malignant neoplasm of cervix: Secondary | ICD-10-CM | POA: Insufficient documentation

## 2021-03-21 NOTE — Progress Notes (Signed)
GYNECOLOGY OFFICE VISIT NOTE  History:   Maria Myers is a 55 y.o. [id number] here today for pap smear screening. Declines breast examination, already had normal mammogram. Already had annual examination with her PCP, only missing pap smear screening. Has been menopausal for 5-6 years.  She denies any abnormal vaginal discharge, bleeding, pelvic pain or other concerns.    Past Medical History:  Diagnosis Date   Abnormal stress test 01/04/2018   Stress echo poor exercise tolerance only 3 minutes and LAD ischemia with chest pain   Acquired hypothyroidism 09/06/2017   Acute viral syndrome 10/25/2018   Allergy    Angina pectoris (HCC) 01/04/2018   Annual physical exam 08/23/2015   Overview:  Last Assessment & Plan:  Counseled on health behaviors-encouraged to continue healthy diet, encouraged to add more exercise. Pap up-to-date, opts for mammogram screening every 2 years and this is up-to-date as well. Continue self breast exams monthly. Screening labs ordered. PIP joint pain-she is advised to try Voltaren as needed, if the pain worsens follow-up for further evaluation, no   Anxiety 10/31/2017   Chest pressure 07/23/2017   Coronary artery disease    01/07/18 PCI/DES x1, normal EF via LV gram.    Dyslipidemia 02/18/2019   Dysthymia 08/01/2017   Environmental and seasonal allergies 08/12/2014   Overview:  Last Assessment & Plan:  She wants to resume allergy injections, records requested. Last Assessment & Plan:  She wants to resume allergy injections, records requested.  Formatting of this note might be different from the original. Last Assessment & Plan:  She wants to resume allergy injections, records requested. Last Assessment & Plan:  Formatting of this note might be different from t   GERD (gastroesophageal reflux disease) 09/06/2017   History of viral illness 11/04/2018   Hypothyroidism 10/20/2011   Formatting of this note might be different from the original. Last Assessment & Plan:  She has not taken  levothyroxine since I last saw her last year, she is using an [place] supplement for thyroid disease, she would like to resume levothyroxine if her thyroid panel is not in the reference range on the supplement. Last Assessment & Plan:  Formatting of this note might be different from the original   Localized swelling, mass or lump of neck 08/01/2017   Mass of neck 08/01/2017   Seasonal allergic rhinitis due to pollen 07/23/2017   Status post coronary artery stent placement    Thyroid disease    Unstable angina (HCC) 01/07/2018   Vitamin D deficiency 09/06/2017    Past Surgical History:  Procedure Laterality Date   CESAREAN SECTION     CORONARY STENT INTERVENTION N/A 01/07/2018   Procedure: CORONARY STENT INTERVENTION;  Surgeon: Kathleene Hazel, MD;  Location: [facility];  Service: Cardiovascular;  Laterality: N/A;   LEFT HEART CATH AND CORONARY ANGIOGRAPHY N/A 01/07/2018   Procedure: LEFT HEART CATH AND CORONARY ANGIOGRAPHY;  Surgeon: Kathleene Hazel, MD;  Location: [facility] CV [facility];  Service: Cardiovascular;  Laterality: N/A;    The following portions of the patient's history were reviewed and updated as appropriate: allergies, current medications, past family history, past medical history, past social history, past surgical history and problem list.   Health Maintenance:  Normal pap and negative HRHPV in 2019.  Normal mammogram on 01/03/2021.   Review of Systems:  Pertinent items noted in HPI and remainder of comprehensive ROS otherwise negative.  Physical Exam:  BP 130/80   Pulse 61   Ht 5'  2" (1.575 m)   Wt 157 lb (71.2 kg)   BMI 28.72 kg/m  CONSTITUTIONAL: Well-developed, well-nourished female in no acute distress.  MUSCULOSKELETAL: Normal range of motion. No edema noted. NEUROLOGIC: Alert and oriented to person, place, and time. Normal muscle tone coordination. No cranial nerve deficit noted. PSYCHIATRIC: Normal mood and affect. Normal behavior. Normal judgment  and thought content. CARDIOVASCULAR: Normal heart rate noted RESPIRATORY: Effort and breath sounds normal, no problems with respiration noted ABDOMEN: No masses noted. No other overt distention noted.   PELVIC: Normal appearing external genitalia; normal urethral meatus; normal appearing vaginal mucosa and cervix but with moderate atrophy. Pap smear done, cervical stenosis noted.  No abnormal discharge noted.  Very uncomfortable during examination. Performed in the presence of a chaperone     Assessment and Plan:     Pap smear for cervical cancer screening - Cytology - PAP done, will follow up results and manage accordingly. Routine preventative health maintenance measures emphasized. Please refer to After Visit Summary for other counseling recommendations.   Return for any gynecologic concerns.    I spent 20 minutes dedicated to the care of this patient including pre-visit review of records, face to face time with the patient discussing her conditions and treatments and post visit orders.    Jaynie Collins, MD, FACOG Obstetrician & Gynecologist, [facility] for [employer], [facility]

## 2021-03-21 NOTE — Patient Instructions (Signed)
Pap Test °Why am I having this test? °A Pap test, also called a Pap smear, is a screening test to check for signs of: °Infection. °Cancer of the cervix. The cervix is the lower part of the uterus that opens into the vagina. °Changes that may be a sign that cancer is developing (precancerous changes). °Women need this test on a regular basis. In general, you should have a Pap test every 3 years until you reach menopause or age 55. Women aged 30-60 may choose to have their Pap test done at the same time as an HPV (human papillomavirus) test every 5 years (instead of every 3 years). °Your health care provider may recommend having Pap tests more or less often depending on your medical conditions and past Pap test results. °What is being tested? °Cervical cells are tested for signs of infection or abnormalities. °What kind of sample is taken? °Your health care provider will collect a sample of cells from the surface of your cervix. This will be done using a small cotton swab, plastic spatula, or brush that is inserted into your vagina using a tool called a speculum. This sample is often collected during a pelvic exam, when you are lying on your back on an exam table with your feet in footrests (stirrups). In some cases, fluids (secretions) from the cervix or vagina may also be collected. °How do I prepare for this test? °Be aware of where you are in your menstrual cycle. If you are menstruating on the day of the test, you may be asked to reschedule. °You may need to reschedule if you have a known vaginal infection on the day of the test. °Follow instructions from your health care provider about: °Changing or stopping your regular medicines. Some medicines can cause abnormal test results, such as vaginal medicines and tetracycline. °Avoiding douching 2-3 days before or the day of the test. °Tell a health care provider about: °Any allergies you have. °All medicines you are taking, including vitamins, herbs, eye drops,  creams, and over-the-counter medicines. °Any bleeding problems you have. °Any surgeries you have had. °Any medical conditions you have. °Whether you are pregnant or may be pregnant. °How are the results reported? °Your test results will be reported as either abnormal or normal. °What do the results mean? °A normal test result means that you do not have signs of cancer of the cervix. °An abnormal result may mean that you have: °Cancer. A Pap test by itself is not enough to diagnose cancer. You will have more tests done if cancer is suspected. °Precancerous changes in your cervix. °Inflammation of the cervix. °An STI (sexually transmitted infection). °A fungal infection. °A parasite infection. °Talk with your health care provider about what your results mean. In some cases, your health care provider may do more testing to confirm the results. °Questions to ask your health care provider °Ask your health care provider, or the department that is doing the test: °When will my results be ready? °How will I get my results? °What are my treatment options? °What other tests do I need? °What are my next steps? °Summary °In general, women should have a Pap test every 3 years until they reach menopause or age 55. °Your health care provider will collect a sample of cells from the surface of your cervix. This will be done using a small cotton swab, plastic spatula, or brush. °In some cases, fluids (secretions) from the cervix or vagina may also be collected. °This information is not intended   to replace advice given to you by your health care provider. Make sure you discuss any questions you have with your health care provider. °Document Revised: 08/20/2020 Document Reviewed: 08/20/2020 °Elsevier Patient Education © 2022 Elsevier Inc. ° °

## 2021-03-23 LAB — CYTOLOGY - PAP
Comment: NEGATIVE
Diagnosis: NEGATIVE
High risk HPV: NEGATIVE

## 2021-04-03 DIAGNOSIS — M7551 Bursitis of right shoulder: Secondary | ICD-10-CM | POA: Diagnosis not present

## 2021-04-21 ENCOUNTER — Other Ambulatory Visit: Payer: Self-pay

## 2021-04-25 ENCOUNTER — Other Ambulatory Visit: Payer: Self-pay

## 2021-04-25 ENCOUNTER — Encounter: Payer: Self-pay | Admitting: Family Medicine

## 2021-04-25 ENCOUNTER — Ambulatory Visit: Payer: Federal, State, Local not specified - PPO | Admitting: Family Medicine

## 2021-04-25 VITALS — BP 118/66 | HR 58 | Temp 97.8°F | Ht 62.0 in | Wt 158.0 lb

## 2021-04-25 DIAGNOSIS — Z0000 Encounter for general adult medical examination without abnormal findings: Secondary | ICD-10-CM

## 2021-04-25 DIAGNOSIS — I2510 Atherosclerotic heart disease of native coronary artery without angina pectoris: Secondary | ICD-10-CM

## 2021-04-25 DIAGNOSIS — E039 Hypothyroidism, unspecified: Secondary | ICD-10-CM

## 2021-04-25 DIAGNOSIS — E559 Vitamin D deficiency, unspecified: Secondary | ICD-10-CM | POA: Diagnosis not present

## 2021-04-25 LAB — URINALYSIS, ROUTINE W REFLEX MICROSCOPIC
Bilirubin Urine: NEGATIVE
Hgb urine dipstick: NEGATIVE
Ketones, ur: NEGATIVE
Leukocytes,Ua: NEGATIVE
Nitrite: NEGATIVE
RBC / HPF: NONE SEEN (ref 0–?)
Specific Gravity, Urine: 1.01 (ref 1.000–1.030)
Total Protein, Urine: NEGATIVE
Urine Glucose: NEGATIVE
Urobilinogen, UA: 0.2 (ref 0.0–1.0)
WBC, UA: NONE SEEN (ref 0–?)
pH: 6 (ref 5.0–8.0)

## 2021-04-25 LAB — CBC
HCT: 39.3 % (ref 36.0–46.0)
Hemoglobin: 12.9 g/dL (ref 12.0–15.0)
MCHC: 32.9 g/dL (ref 30.0–36.0)
MCV: 85.8 fl (ref 78.0–100.0)
Platelets: 208 10*3/uL (ref 150.0–400.0)
RBC: 4.58 Mil/uL (ref 3.87–5.11)
RDW: 13.7 % (ref 11.5–15.5)
WBC: 4.7 10*3/uL (ref 4.0–10.5)

## 2021-04-25 LAB — COMPREHENSIVE METABOLIC PANEL
ALT: 19 U/L (ref 0–35)
AST: 22 U/L (ref 0–37)
Albumin: 4.3 g/dL (ref 3.5–5.2)
Alkaline Phosphatase: 77 U/L (ref 39–117)
BUN: 14 mg/dL (ref 6–23)
CO2: 30 mEq/L (ref 19–32)
Calcium: 9.2 mg/dL (ref 8.4–10.5)
Chloride: 104 mEq/L (ref 96–112)
Creatinine, Ser: 0.68 mg/dL (ref 0.40–1.20)
GFR: 97.8 mL/min (ref >60.00)
Glucose, Bld: 67 mg/dL — ABNORMAL LOW (ref 70–99)
Potassium: 4.2 mEq/L (ref 3.5–5.1)
Sodium: 140 mEq/L (ref 135–145)
Total Bilirubin: 0.3 mg/dL (ref 0.2–1.2)
Total Protein: 7.3 g/dL (ref 6.0–8.3)

## 2021-04-25 LAB — LIPID PANEL
Cholesterol: 124 mg/dL (ref 0–200)
HDL: 51.6 mg/dL (ref >39.00)
LDL Cholesterol: 52 mg/dL (ref 0–99)
NonHDL: 72.65
Total CHOL/HDL Ratio: 2
Triglycerides: 104 mg/dL (ref 0.0–149.0)
VLDL: 20.8 mg/dL (ref 0.0–40.0)

## 2021-04-25 LAB — TSH: TSH: 1.06 u[IU]/mL (ref 0.35–5.50)

## 2021-04-25 LAB — VITAMIN D 25 HYDROXY (VIT D DEFICIENCY, FRACTURES): VITD: 22.06 ng/mL — ABNORMAL LOW (ref 30.00–100.00)

## 2021-04-25 NOTE — Progress Notes (Signed)
Established Patient Office Visit  Subjective:  Patient ID: Maria Myers, female    DOB: 14-Feb-1966  Age: 56 y.o. MRN: [id number]  CC:  Chief Complaint  Patient presents with   Follow-up    6 month f/u, no concerns.  Declines flu shot.     HPI Maria Myers presents for follow-up of hypothyroid is, vitamin D deficiency and elevated cholesterol.  She is status post coronary artery stent placement.  She has done well.  Denies any chest pain pressure shortness of breath or dyspnea.  She has not required use of her nitroglycerin.  He is trying to remember to take her high-dose vitamin D pill weekly.  Continues levothyroxine on a fasting stomach each morning.  With 2 kids in college she is working 2 jobs.  Full-time job is for the [employer] and part-time with [employer].  Nonfasting today.  Status post recent Pap smear and mammogram that were both normal.  Past Medical History:  Diagnosis Date   Abnormal stress test 01/04/2018   Stress echo poor exercise tolerance only 3 minutes and LAD ischemia with chest pain   Acquired hypothyroidism 09/06/2017   Acute viral syndrome 10/25/2018   Allergy    Angina pectoris (HCC) 01/04/2018   Annual physical exam 08/23/2015   Overview:  Last Assessment & Plan:  Counseled on health behaviors-encouraged to continue healthy diet, encouraged to add more exercise. Pap up-to-date, opts for mammogram screening every 2 years and this is up-to-date as well. Continue self breast exams monthly. Screening labs ordered. PIP joint pain-she is advised to try Voltaren as needed, if the pain worsens follow-up for further evaluation, no   Anxiety 10/31/2017   Chest pressure 07/23/2017   Coronary artery disease    01/07/18 PCI/DES x1, normal EF via LV gram.    Dyslipidemia 02/18/2019   Dysthymia 08/01/2017   Environmental and seasonal allergies 08/12/2014   Overview:  Last Assessment & Plan:  She wants to resume allergy injections, records requested. Last Assessment & Plan:  She wants to  resume allergy injections, records requested.  Formatting of this note might be different from the original. Last Assessment & Plan:  She wants to resume allergy injections, records requested. Last Assessment & Plan:  Formatting of this note might be different from t   GERD (gastroesophageal reflux disease) 09/06/2017   History of viral illness 11/04/2018   Hypothyroidism 10/20/2011   Formatting of this note might be different from the original. Last Assessment & Plan:  She has not taken levothyroxine since I last saw her last year, she is using an [place] supplement for thyroid disease, she would like to resume levothyroxine if her thyroid panel is not in the reference range on the supplement. Last Assessment & Plan:  Formatting of this note might be different from the original   Localized swelling, mass or lump of neck 08/01/2017   Mass of neck 08/01/2017   Seasonal allergic rhinitis due to pollen 07/23/2017   Status post coronary artery stent placement    Thyroid disease    Unstable angina (HCC) 01/07/2018   Vitamin D deficiency 09/06/2017    Past Surgical History:  Procedure Laterality Date   CESAREAN SECTION     CORONARY STENT INTERVENTION N/A 01/07/2018   Procedure: CORONARY STENT INTERVENTION;  Surgeon: Kathleene Hazel, MD;  Location: [facility];  Service: Cardiovascular;  Laterality: N/A;   LEFT HEART CATH AND CORONARY ANGIOGRAPHY N/A 01/07/2018   Procedure: LEFT HEART CATH AND CORONARY ANGIOGRAPHY;  Surgeon: Burnell Blanks, MD;  Location: [facility];  Service: Cardiovascular;  Laterality: N/A;    Family History  Problem Relation Age of Onset   Arthritis Sister     Social History   Socioeconomic History   Marital status: Married    Spouse name: Not on file   Number of children: Not on file   Years of education: Not on file   Highest education level: Not on file  Occupational History   Not on file  Tobacco Use   Smoking status: Never   Smokeless tobacco:  Never  Vaping Use   Vaping Use: Never used  Substance and Sexual Activity   Alcohol use: Never   Drug use: No   Sexual activity: Not on file  Other Topics Concern   Not on file  Social History Narrative   Not on file   Social Determinants of Health   Financial Resource Strain: Not on file  Food Insecurity: Not on file  Transportation Needs: Not on file  Physical Activity: Not on file  Stress: Not on file  Social Connections: Not on file  Intimate Partner Violence: Not on file    Outpatient Medications Prior to Visit  Medication Sig Dispense Refill   albuterol (PROVENTIL HFA;VENTOLIN HFA) 108 (90 Base) MCG/ACT inhaler Inhale 2 puffs into the lungs every 4 (four) hours as needed for wheezing or shortness of breath. 1 Inhaler 1   aspirin EC 81 MG tablet Take 1 tablet (81 mg total) by mouth daily. 90 tablet 3   atorvastatin (LIPITOR) 40 MG tablet TAKE 1 TABLET BY MOUTH EVERY DAY 90 tablet 3   fluticasone (FLONASE) 50 MCG/ACT nasal spray Place 2 sprays into both nostrils daily. 16 g 6   levothyroxine (SYNTHROID) 75 MCG tablet TAKE 1 TABLET(75 MCG) BY MOUTH DAILY BEFORE AND BREAKFAST 90 tablet 1   metoprolol succinate (TOPROL-XL) 25 MG 24 hr tablet TAKE ONE TABLET BY MOUTH DAILY. TAKE IMMEDIATLEY FOLLOWING A MEAL (Patient taking differently: Take 25 mg by mouth daily.) 90 tablet 3   nitroGLYCERIN (NITROSTAT) 0.4 MG SL tablet Place 0.4 mg under the tongue every 5 (five) minutes as needed for chest pain.     Vitamin D, Ergocalciferol, (DRISDOL) 1.25 MG (50000 UNIT) CAPS capsule TAKE 1 CAPSULE BY MOUTH ONCE A WEEK (Patient taking differently: Take 50,000 Units by mouth every 7 (seven) days.) 7 capsule 0   No facility-administered medications prior to visit.    No Known Allergies  ROS Review of Systems  Constitutional:  Negative for chills, diaphoresis, fatigue, fever and unexpected weight change.  Eyes:  Negative for photophobia and visual disturbance.  Respiratory: Negative.     Cardiovascular: Negative.   Gastrointestinal: Negative.   Endocrine: Negative for polyphagia and polyuria.  Genitourinary: Negative.   Musculoskeletal:  Negative for gait problem and joint swelling.  Skin:  Negative for pallor and rash.  Allergic/Immunologic: Negative for immunocompromised state.  Neurological:  Negative for speech difficulty, weakness, light-headedness and numbness.  Hematological:  Does not bruise/bleed easily.  Psychiatric/Behavioral: Negative.       Objective:    Physical Exam Vitals and nursing note reviewed.  Constitutional:      General: She is not in acute distress.    Appearance: Normal appearance. She is not ill-appearing, toxic-appearing or diaphoretic.  HENT:     Head: Normocephalic and atraumatic.     Right Ear: Tympanic membrane, ear canal and external ear normal.     Left Ear: Ear canal and  external ear normal.     Mouth/Throat:     Mouth: Mucous membranes are moist.     Pharynx: Oropharynx is clear. No oropharyngeal exudate or posterior oropharyngeal erythema.  Eyes:     General: No scleral icterus.       Right eye: No discharge.        Left eye: No discharge.     Extraocular Movements: Extraocular movements intact.     Conjunctiva/sclera: Conjunctivae normal.     Pupils: Pupils are equal, round, and reactive to light.  Neck:     Vascular: No carotid bruit.  Cardiovascular:     Rate and Rhythm: Normal rate and regular rhythm.     Pulses: Normal pulses.     Heart sounds: Normal heart sounds.  Pulmonary:     Effort: Pulmonary effort is normal.     Breath sounds: Normal breath sounds.  Abdominal:     General: Bowel sounds are normal.  Musculoskeletal:     Cervical back: No rigidity or tenderness.     Right lower leg: No edema.     Left lower leg: No edema.  Lymphadenopathy:     Cervical: No cervical adenopathy.  Skin:    General: Skin is warm and dry.  Neurological:     Mental Status: She is alert and oriented to person, place, and  time.  Psychiatric:        Mood and Affect: Mood normal.        Behavior: Behavior normal.    BP 118/66   Pulse (!) 58   Temp 97.8 F (36.6 C) (Temporal)   Ht 5\' 2"  (1.575 m)   Wt 158 lb (71.7 kg)   SpO2 98%   BMI 28.90 kg/m  Wt Readings from Last 3 Encounters:  04/25/21 158 lb (71.7 kg)  03/21/21 157 lb (71.2 kg)  10/18/20 160 lb 3.2 oz (72.7 kg)     Health Maintenance Due  Topic Date Due   Pneumococcal Vaccine 48-37 Years old (1 - PCV) Never done   Hepatitis C Screening  Never done   TETANUS/TDAP  Never done   Zoster Vaccines- Shingrix (1 of 2) Never done   COVID-19 Vaccine (4 - Booster for Moderna series) 12/12/2020   INFLUENZA VACCINE  01/03/2021    There are no preventive care reminders to display for this patient.  Lab Results  Component Value Date   TSH 0.95 10/18/2020   Lab Results  Component Value Date   WBC 5.6 10/18/2020   HGB 13.3 10/18/2020   HCT 39.3 10/18/2020   MCV 84.3 10/18/2020   PLT 187.0 10/18/2020   Lab Results  Component Value Date   NA 139 10/18/2020   K 4.0 10/18/2020   CO2 27 10/18/2020   GLUCOSE 86 10/18/2020   BUN 13 10/18/2020   CREATININE 0.65 10/18/2020   BILITOT 0.4 10/18/2020   ALKPHOS 89 10/18/2020   AST 21 10/18/2020   ALT 23 10/18/2020   PROT 7.3 10/18/2020   ALBUMIN 4.3 10/18/2020   CALCIUM 8.9 10/18/2020   ANIONGAP 8 01/08/2018   GFR 99.23 10/18/2020   Lab Results  Component Value Date   CHOL 116 09/22/2020   Lab Results  Component Value Date   HDL 53 09/22/2020   Lab Results  Component Value Date   LDLCALC 41 09/22/2020   Lab Results  Component Value Date   TRIG 128 09/22/2020   Lab Results  Component Value Date   CHOLHDL 2.2 09/22/2020   Lab Results  Component Value Date   HGBA1C 6.1 10/18/2020      Assessment & Plan:   Problem List Items Addressed This Visit       Cardiovascular and Mediastinum   Coronary artery disease drug-eluting stent to mid LAD in August 2019 secondary to  complete occlusion   Relevant Orders   Comprehensive metabolic panel   Lipid panel     Endocrine   Acquired hypothyroidism - Primary   Relevant Orders   CBC   TSH     Other   Vitamin D deficiency   Relevant Orders   VITAMIN D 25 Hydroxy (Vit-D Deficiency, Fractures)   Healthcare maintenance   Relevant Orders   Urinalysis, Routine w reflex microscopic    No orders of the defined types were placed in this encounter.   Follow-up: Return in about 6 months (around 10/23/2021).  Patient declines flu vaccine because it made her sick in the past.  She will consider the Shingrix vaccine next visit.  Given information on health maintenance and recommended immunizations.  Also given information on preventative care.  Libby Maw, MD

## 2021-04-30 ENCOUNTER — Other Ambulatory Visit: Payer: Self-pay | Admitting: Family Medicine

## 2021-04-30 DIAGNOSIS — E559 Vitamin D deficiency, unspecified: Secondary | ICD-10-CM

## 2021-07-21 ENCOUNTER — Telehealth: Payer: Self-pay | Admitting: Family Medicine

## 2021-07-21 ENCOUNTER — Other Ambulatory Visit: Payer: Self-pay

## 2021-07-21 DIAGNOSIS — J301 Allergic rhinitis due to pollen: Secondary | ICD-10-CM

## 2021-07-21 MED ORDER — FLUTICASONE PROPIONATE 50 MCG/ACT NA SUSP: 2.0000 | Freq: Every day | NASAL | 6 refills | Status: DC

## 2021-07-21 NOTE — Telephone Encounter (Signed)
Pt called requesting Rx for Flonase

## 2021-07-21 NOTE — Telephone Encounter (Signed)
Rx sent in

## 2021-08-18 ENCOUNTER — Other Ambulatory Visit: Payer: Self-pay | Admitting: Family Medicine

## 2021-10-24 ENCOUNTER — Ambulatory Visit: Payer: Federal, State, Local not specified - PPO | Admitting: Family Medicine

## 2021-10-24 ENCOUNTER — Encounter: Payer: Self-pay | Admitting: Family Medicine

## 2021-10-24 VITALS — BP 114/68 | HR 55 | Temp 97.0°F | Ht 62.0 in | Wt 157.4 lb

## 2021-10-24 DIAGNOSIS — E039 Hypothyroidism, unspecified: Secondary | ICD-10-CM

## 2021-10-24 DIAGNOSIS — J398 Other specified diseases of upper respiratory tract: Secondary | ICD-10-CM

## 2021-10-24 DIAGNOSIS — Z23 Encounter for immunization: Secondary | ICD-10-CM | POA: Diagnosis not present

## 2021-10-24 DIAGNOSIS — E559 Vitamin D deficiency, unspecified: Secondary | ICD-10-CM

## 2021-10-24 LAB — TSH: TSH: 1.15 u[IU]/mL (ref 0.35–5.50)

## 2021-10-24 LAB — VITAMIN D 25 HYDROXY (VIT D DEFICIENCY, FRACTURES): VITD: 19.28 ng/mL — ABNORMAL LOW (ref 30.00–100.00)

## 2021-10-24 NOTE — Progress Notes (Signed)
Established Patient Office Visit  Subjective   Patient ID: Maria Myers, female    DOB: 18-Mar-1966  Age: 56 y.o. MRN: [id number]  Chief Complaint  Patient presents with   Follow-up    6 month follow up, no concerns. Patient not fasting.     HPI follow-up of hypothyroidism, vitamin D deficiency.  She is taking her levothyroxine as directed on a fasting stomach 30 minutes before eating.  She has started the high-dose weekly 3 pills.  Ongoing soreness neck area.  No voice change.  No fever chills or sore throat.    Review of Systems  Constitutional:  Negative for chills, diaphoresis, fever, malaise/fatigue and weight loss.  HENT: Negative.  Negative for sore throat.   Eyes: Negative.  Negative for blurred vision and double vision.  Respiratory:  Negative for cough, shortness of breath and wheezing.   Cardiovascular:  Negative for chest pain.  Gastrointestinal:  Negative for abdominal pain.  Genitourinary: Negative.   Musculoskeletal:  Negative for falls and myalgias.  Neurological:  Negative for speech change, loss of consciousness and weakness.  Psychiatric/Behavioral: Negative.       Objective:     BP 114/68 (BP Location: Right Arm, Patient Position: Sitting, Cuff Size: Normal)   Pulse (!) 55   Temp (!) 97 F (36.1 C) (Temporal)   Ht 5\' 2"  (1.575 m)   Wt 157 lb 6.4 oz (71.4 kg)   SpO2 97%   BMI 28.79 kg/m    Physical Exam Constitutional:      General: She is not in acute distress.    Appearance: Normal appearance. She is not ill-appearing, toxic-appearing or diaphoretic.  HENT:     Head: Normocephalic and atraumatic.     Right Ear: External ear normal.     Left Ear: External ear normal.     Mouth/Throat:     Mouth: Mucous membranes are moist.     Pharynx: Oropharynx is clear. No oropharyngeal exudate or posterior oropharyngeal erythema.  Eyes:     General: No scleral icterus.       Right eye: No discharge.        Left eye: No discharge.     Extraocular  Movements: Extraocular movements intact.     Conjunctiva/sclera: Conjunctivae normal.     Pupils: Pupils are equal, round, and reactive to light.  Neck:     Thyroid: No thyroid mass, thyromegaly or thyroid tenderness.     Trachea: Tracheal tenderness present.  Cardiovascular:     Rate and Rhythm: Normal rate and regular rhythm.  Pulmonary:     Effort: Pulmonary effort is normal. No respiratory distress.     Breath sounds: Normal breath sounds.  Abdominal:     General: Bowel sounds are normal.  Musculoskeletal:     Cervical back: No rigidity or tenderness.  Skin:    General: Skin is warm and dry.  Neurological:     Mental Status: She is alert and oriented to person, place, and time.  Psychiatric:        Mood and Affect: Mood normal.        Behavior: Behavior normal.     No results found for any visits on 10/24/21.    The ASCVD Risk score (Arnett DK, et al., 2019) failed to calculate for the following reasons:   The valid total cholesterol range is 130 to 320 mg/dL    Assessment & Plan:   Problem List Items Addressed This Visit       Endocrine  Acquired hypothyroidism   Relevant Orders   TSH     Other   Vitamin D deficiency - Primary   Relevant Orders   VITAMIN D 25 Hydroxy (Vit-D Deficiency, Fractures)   Other Visit Diagnoses     Need for Tdap vaccination       Relevant Orders   Tdap vaccine greater than or equal to 7yo IM (Completed)   Disease of trachea       Relevant Orders   US Soft Tissue Head/Neck (NON-THYROID)       Return in about 6 months (around 04/26/2022), or Consider ENt referral for tender pharynx.  Rechecking TSH and vitamin D levels.  We will start ultrasound studies trachea.  Consider ENT with  Libby Maw, MD

## 2021-11-01 ENCOUNTER — Other Ambulatory Visit: Payer: Self-pay

## 2021-11-01 DIAGNOSIS — Z0000 Encounter for general adult medical examination without abnormal findings: Secondary | ICD-10-CM

## 2021-11-01 MED ORDER — METOPROLOL SUCCINATE ER 25 MG PO TB24: 25.0000 mg | ORAL_TABLET | Freq: Every day | ORAL | 0 refills | Status: DC

## 2021-11-04 ENCOUNTER — Other Ambulatory Visit: Payer: Self-pay | Admitting: Family Medicine

## 2021-11-04 DIAGNOSIS — Z0000 Encounter for general adult medical examination without abnormal findings: Secondary | ICD-10-CM

## 2021-11-07 ENCOUNTER — Ambulatory Visit (HOSPITAL_COMMUNITY): Admission: RE | Admit: 2021-11-07 | Payer: Federal, State, Local not specified - PPO | Source: Ambulatory Visit

## 2021-11-14 ENCOUNTER — Ambulatory Visit (HOSPITAL_COMMUNITY)
Admission: RE | Admit: 2021-11-14 | Discharge: 2021-11-14 | Disposition: A | Discharge status: Home / Self Care | Payer: Federal, State, Local not specified - PPO | Source: Ambulatory Visit | Attending: Family Medicine | Admitting: Family Medicine

## 2021-11-14 DIAGNOSIS — J398 Other specified diseases of upper respiratory tract: Secondary | ICD-10-CM | POA: Insufficient documentation

## 2021-11-14 DIAGNOSIS — M542 Cervicalgia: Secondary | ICD-10-CM | POA: Diagnosis not present

## 2021-11-15 ENCOUNTER — Other Ambulatory Visit: Payer: Self-pay | Admitting: Cardiology

## 2021-11-15 DIAGNOSIS — I209 Angina pectoris, unspecified: Secondary | ICD-10-CM

## 2022-01-29 ENCOUNTER — Other Ambulatory Visit: Payer: Self-pay | Admitting: Family Medicine

## 2022-01-29 DIAGNOSIS — Z0000 Encounter for general adult medical examination without abnormal findings: Secondary | ICD-10-CM

## 2022-02-16 ENCOUNTER — Other Ambulatory Visit: Payer: Self-pay | Admitting: Family Medicine

## 2022-02-16 ENCOUNTER — Other Ambulatory Visit: Payer: Self-pay | Admitting: Cardiology

## 2022-02-16 DIAGNOSIS — I209 Angina pectoris, unspecified: Secondary | ICD-10-CM

## 2022-02-16 DIAGNOSIS — E039 Hypothyroidism, unspecified: Secondary | ICD-10-CM

## 2022-02-28 ENCOUNTER — Other Ambulatory Visit: Payer: Self-pay | Admitting: Cardiology

## 2022-02-28 DIAGNOSIS — I209 Angina pectoris, unspecified: Secondary | ICD-10-CM

## 2022-04-18 ENCOUNTER — Other Ambulatory Visit: Payer: Self-pay | Admitting: Cardiology

## 2022-04-18 DIAGNOSIS — I209 Angina pectoris, unspecified: Secondary | ICD-10-CM

## 2022-05-08 ENCOUNTER — Other Ambulatory Visit: Payer: Self-pay | Admitting: Family Medicine

## 2022-05-08 ENCOUNTER — Other Ambulatory Visit: Payer: Self-pay | Admitting: Cardiology

## 2022-05-08 ENCOUNTER — Telehealth: Payer: Self-pay | Admitting: Family Medicine

## 2022-05-08 DIAGNOSIS — J301 Allergic rhinitis due to pollen: Secondary | ICD-10-CM

## 2022-05-08 DIAGNOSIS — I209 Angina pectoris, unspecified: Secondary | ICD-10-CM

## 2022-05-08 DIAGNOSIS — Z0000 Encounter for general adult medical examination without abnormal findings: Secondary | ICD-10-CM

## 2022-05-08 DIAGNOSIS — E039 Hypothyroidism, unspecified: Secondary | ICD-10-CM

## 2022-05-08 DIAGNOSIS — E559 Vitamin D deficiency, unspecified: Secondary | ICD-10-CM

## 2022-05-08 MED ORDER — LEVOTHYROXINE SODIUM 75 MCG PO TABS: 75.0000 ug | ORAL_TABLET | Freq: Every day | ORAL | 1 refills | Status: DC

## 2022-05-08 MED ORDER — VITAMIN D (ERGOCALCIFEROL) 1.25 MG (50000 UNIT) PO CAPS: ORAL_CAPSULE | ORAL | 3 refills | Status: DC

## 2022-05-08 MED ORDER — FLUTICASONE PROPIONATE 50 MCG/ACT NA SUSP: 2.0000 | Freq: Every day | NASAL | 6 refills | Status: AC

## 2022-05-08 MED ORDER — METOPROLOL SUCCINATE ER 25 MG PO TB24: ORAL_TABLET | ORAL | 1 refills | Status: DC

## 2022-05-08 NOTE — Telephone Encounter (Signed)
Caller Name: Deah Ottaway Call back phone #: [phone]  Reason for Call: Pt is saying that she needs  a refill on all current medications. She does not believe she needs a f/u until May. Please send to  [id number] - [place], [place] - [address] AT [facility] [address] & [address]  Phone:[phone]:[phone]

## 2022-05-08 NOTE — Telephone Encounter (Signed)
Refills sent to pharmacy. 

## 2022-05-09 ENCOUNTER — Other Ambulatory Visit: Payer: Self-pay | Admitting: Family Medicine

## 2022-05-09 DIAGNOSIS — Z0000 Encounter for general adult medical examination without abnormal findings: Secondary | ICD-10-CM

## 2022-05-15 ENCOUNTER — Ambulatory Visit: Payer: Federal, State, Local not specified - PPO | Admitting: Family Medicine

## 2022-05-15 ENCOUNTER — Encounter: Payer: Self-pay | Admitting: Family Medicine

## 2022-05-15 VITALS — BP 136/74 | HR 59 | Temp 97.2°F | Ht 62.0 in | Wt 155.6 lb

## 2022-05-15 DIAGNOSIS — I2510 Atherosclerotic heart disease of native coronary artery without angina pectoris: Secondary | ICD-10-CM | POA: Diagnosis not present

## 2022-05-15 DIAGNOSIS — E039 Hypothyroidism, unspecified: Secondary | ICD-10-CM

## 2022-05-15 DIAGNOSIS — I209 Angina pectoris, unspecified: Secondary | ICD-10-CM | POA: Diagnosis not present

## 2022-05-15 DIAGNOSIS — R0789 Other chest pain: Secondary | ICD-10-CM

## 2022-05-15 DIAGNOSIS — E559 Vitamin D deficiency, unspecified: Secondary | ICD-10-CM

## 2022-05-15 LAB — CBC
HCT: 38.7 % (ref 36.0–46.0)
Hemoglobin: 13 g/dL (ref 12.0–15.0)
MCHC: 33.5 g/dL (ref 30.0–36.0)
MCV: 85.7 fl (ref 78.0–100.0)
Platelets: 207 10*3/uL (ref 150.0–400.0)
RBC: 4.51 Mil/uL (ref 3.87–5.11)
RDW: 13.2 % (ref 11.5–15.5)
WBC: 4.9 10*3/uL (ref 4.0–10.5)

## 2022-05-15 LAB — COMPREHENSIVE METABOLIC PANEL
ALT: 18 U/L (ref 0–35)
AST: 19 U/L (ref 0–37)
Albumin: 4.3 g/dL (ref 3.5–5.2)
Alkaline Phosphatase: 77 U/L (ref 39–117)
BUN: 12 mg/dL (ref 6–23)
CO2: 28 mEq/L (ref 19–32)
Calcium: 9 mg/dL (ref 8.4–10.5)
Chloride: 105 mEq/L (ref 96–112)
Creatinine, Ser: 0.6 mg/dL (ref 0.40–1.20)
GFR: 100.05 mL/min (ref >60.00)
Glucose, Bld: 96 mg/dL (ref 70–99)
Potassium: 4.1 mEq/L (ref 3.5–5.1)
Sodium: 141 mEq/L (ref 135–145)
Total Bilirubin: 0.3 mg/dL (ref 0.2–1.2)
Total Protein: 7.2 g/dL (ref 6.0–8.3)

## 2022-05-15 LAB — VITAMIN D 25 HYDROXY (VIT D DEFICIENCY, FRACTURES): VITD: 41.27 ng/mL (ref 30.00–100.00)

## 2022-05-15 LAB — LDL CHOLESTEROL, DIRECT: Direct LDL: 46 mg/dL

## 2022-05-15 LAB — TSH: TSH: 1.12 u[IU]/mL (ref 0.35–5.50)

## 2022-05-15 MED ORDER — ATORVASTATIN CALCIUM 40 MG PO TABS: 40.0000 mg | ORAL_TABLET | Freq: Every day | ORAL | 0 refills | Status: DC

## 2022-05-15 MED ORDER — ATORVASTATIN CALCIUM 40 MG PO TABS: 40.0000 mg | ORAL_TABLET | Freq: Every day | ORAL | 3 refills | Status: DC

## 2022-05-15 NOTE — Progress Notes (Signed)
Established Patient Office Visit   Subjective:  Patient ID: Maria Myers, female    DOB: 1966/03/23  Age: 56 y.o. MRN: [id number]  Chief Complaint  Patient presents with   Follow-up    Routine follow up on medications. Patient not fasting.     HPI Encounter Diagnoses  Name Primary?   Acquired hypothyroidism Yes   Angina pectoris (HCC)    Vitamin D deficiency    Coronary artery disease involving native coronary artery of native heart without angina pectoris    Chest pressure    Follow-up of elevated cholesterol with history of coronary artery disease.  She is status post stent placement.  She does well with the atorvastatin.  Recently she is noted fleeting bouts of left upper chest pressure.  Denies pain shortness of breath diaphoresis or nausea.  She is not active physically other than her rather active job at the [employer].  Daughter in [place] is still not communicating with her.  Things are otherwise well at home with her husband.   Review of Systems  Constitutional: Negative.  Negative for diaphoresis.  HENT: Negative.    Eyes:  Negative for blurred vision, discharge and redness.  Respiratory: Negative.  Negative for shortness of breath.   Cardiovascular: Negative.  Negative for chest pain.  Gastrointestinal:  Negative for abdominal pain and nausea.  Genitourinary: Negative.   Musculoskeletal: Negative.  Negative for myalgias.  Skin:  Negative for rash.  Neurological:  Negative for tingling, loss of consciousness and weakness.  Endo/Heme/Allergies:  Negative for polydipsia.     Current Outpatient Medications:    albuterol (PROVENTIL HFA;VENTOLIN HFA) 108 (90 Base) MCG/ACT inhaler, Inhale 2 puffs into the lungs every 4 (four) hours as needed for wheezing or shortness of breath., Disp: 1 Inhaler, Rfl: 1   aspirin EC 81 MG tablet, Take 1 tablet (81 mg total) by mouth daily., Disp: 90 tablet, Rfl: 3   levothyroxine (SYNTHROID) 75 MCG tablet, Take 1 tablet (75 mcg total) by  mouth daily before breakfast., Disp: 90 tablet, Rfl: 1   metoprolol succinate (TOPROL-XL) 25 MG 24 hr tablet, TAKE 1 TABLET(25 MG) BY MOUTH DAILY, Disp: 90 tablet, Rfl: 1   nitroGLYCERIN (NITROSTAT) 0.4 MG SL tablet, Place 0.4 mg under the tongue every 5 (five) minutes as needed for chest pain., Disp: , Rfl:    Vitamin D, Ergocalciferol, (DRISDOL) 1.25 MG (50000 UNIT) CAPS capsule, TAKE 1 CAPSULE BY MOUTH 1 TIME A WEEK, Disp: 7 capsule, Rfl: 3   atorvastatin (LIPITOR) 40 MG tablet, Take 1 tablet (40 mg total) by mouth daily. Patient needs an appointment for further refills. 3 rd/final attempt, Disp: 90 tablet, Rfl: 3   fluticasone (FLONASE) 50 MCG/ACT nasal spray, Place 2 sprays into both nostrils daily. (Patient not taking: Reported on 05/15/2022), Disp: 16 g, Rfl: 6   Objective:     BP 136/74 (BP Location: Left Arm, Patient Position: Sitting, Cuff Size: Normal)   Pulse (!) 59   Temp (!) 97.2 F (36.2 C) (Temporal)   Ht 5\' 2"  (1.575 m)   Wt 155 lb 9.6 oz (70.6 kg)   LMP  (LMP Unknown)   SpO2 99%   BMI 28.46 kg/m    Physical Exam Constitutional:      General: She is not in acute distress.    Appearance: Normal appearance. She is not ill-appearing, toxic-appearing or diaphoretic.  HENT:     Head: Normocephalic and atraumatic.     Right Ear: External ear normal.  Left Ear: External ear normal.     Mouth/Throat:     Mouth: Mucous membranes are moist.     Pharynx: Oropharynx is clear. No oropharyngeal exudate or posterior oropharyngeal erythema.  Eyes:     General: No scleral icterus.       Right eye: No discharge.        Left eye: No discharge.     Extraocular Movements: Extraocular movements intact.     Conjunctiva/sclera: Conjunctivae normal.  Neck:     Vascular: No carotid bruit.  Cardiovascular:     Rate and Rhythm: Normal rate and regular rhythm.  Pulmonary:     Effort: Pulmonary effort is normal. No respiratory distress.     Breath sounds: Normal breath sounds.   Abdominal:     General: Bowel sounds are normal.  Musculoskeletal:     Cervical back: No rigidity or tenderness.  Lymphadenopathy:     Cervical: No cervical adenopathy.  Skin:    General: Skin is warm and dry.  Neurological:     Mental Status: She is alert and oriented to person, place, and time.  Psychiatric:        Mood and Affect: Mood normal.        Behavior: Behavior normal.      No results found for any visits on 05/15/22.    The ASCVD Risk score (Arnett DK, et al., 2019) failed to calculate for the following reasons:   The valid total cholesterol range is 130 to 320 mg/dL    Assessment & Plan:   Acquired hypothyroidism -     TSH  Angina pectoris (HCC)  Vitamin D deficiency -     VITAMIN D 25 Hydroxy (Vit-D Deficiency, Fractures)  Coronary artery disease involving native coronary artery of native heart without angina pectoris -     CBC -     Comprehensive metabolic panel -     LDL cholesterol, direct -     Atorvastatin Calcium; Take 1 tablet (40 mg total) by mouth daily. Patient needs an appointment for further refills. 3 rd/final attempt  Dispense: 90 tablet; Refill: 3  Chest pressure -     Ambulatory referral to Cardiology    No follow-ups on file.    Mliss Sax, MD

## 2022-06-21 ENCOUNTER — Ambulatory Visit: Payer: Federal, State, Local not specified - PPO | Admitting: Cardiology

## 2022-07-11 ENCOUNTER — Encounter: Payer: Self-pay | Admitting: Cardiology

## 2022-07-11 ENCOUNTER — Ambulatory Visit: Payer: Federal, State, Local not specified - PPO | Attending: Cardiology | Admitting: Cardiology

## 2022-07-11 VITALS — BP 88/60 | HR 94 | Ht 62.0 in | Wt 159.0 lb

## 2022-07-11 DIAGNOSIS — I25119 Atherosclerotic heart disease of native coronary artery with unspecified angina pectoris: Secondary | ICD-10-CM

## 2022-07-11 DIAGNOSIS — E785 Hyperlipidemia, unspecified: Secondary | ICD-10-CM | POA: Diagnosis not present

## 2022-07-11 DIAGNOSIS — Z955 Presence of coronary angioplasty implant and graft: Secondary | ICD-10-CM

## 2022-07-11 DIAGNOSIS — I2510 Atherosclerotic heart disease of native coronary artery without angina pectoris: Secondary | ICD-10-CM

## 2022-07-11 DIAGNOSIS — I209 Angina pectoris, unspecified: Secondary | ICD-10-CM

## 2022-07-11 MED ORDER — RANOLAZINE ER 500 MG PO TB12: 500.0000 mg | ORAL_TABLET | Freq: Two times a day (BID) | ORAL | 3 refills | Status: DC

## 2022-07-11 NOTE — Patient Instructions (Signed)
Medication Instructions:   STOP: Metoprolol  START: Ranexa 500mg  1 tablet twice daily   Lab Work: None Ordered If you have labs (blood work) drawn today and your tests are completely normal, you will receive your results only by: [place] (if you have MyChart) OR A paper copy in the mail If you have any lab test that is abnormal or we need to change your treatment, we will call you to review the results.   Testing/Procedures: Your physician has requested that you have a lexiscan myoview. For further information please visit [email or web address]. Please follow instruction sheet, as given.  The test will take approximately 3 to 4 hours to complete; you may bring reading material.  If someone comes with you to your appointment, they will need to remain in the main lobby due to limited space in the testing area.   How to prepare for your Myocardial Perfusion Test: Do not eat or drink 3 hours prior to your test, except you may have water. Do not consume products containing caffeine (regular or decaffeinated) 12 hours prior to your test. (ex: coffee, chocolate, sodas, tea). Do bring a list of your current medications with you.  If not listed below, you may take your medications as normal. Do wear comfortable clothes (no dresses or overalls) and walking shoes, tennis shoes preferred (No heels or open toe shoes are allowed). Do NOT wear cologne, perfume, aftershave, or lotions (deodorant is allowed). If these instructions are not followed, your test will have to be rescheduled.     Follow-Up: At [facility], you and your health needs are our priority.  As part of our continuing mission to provide you with exceptional heart care, we have created designated Provider Care Teams.  These Care Teams include your primary Cardiologist (physician) and Advanced Practice Providers (APPs -  Physician Assistants and Nurse Practitioners) who all work together to provide you with the care you need,  when you need it.  We recommend signing up for the patient portal called "MyChart".  Sign up information is provided on this After Visit Summary.  MyChart is used to connect with patients for Virtual Visits (Telemedicine).  Patients are able to view lab/test results, encounter notes, upcoming appointments, etc.  Non-urgent messages can be sent to your provider as well.   To learn more about what you can do with MyChart, go to [email or web address].    Your next appointment:   3 month(s)  The format for your next appointment:   In Person  Provider:   Jenne Campus, MD    Other Instructions NA

## 2022-07-11 NOTE — Progress Notes (Signed)
Cardiology Office Note:    Date:  07/11/2022   ID:  Maria Myers, DOB [birth year], MRN [id number]  PCP:  Libby Maw, MD  Cardiologist:  Jenne Campus, MD    Referring MD: Libby Maw,*  Chief complaint: I have a chest pain   History of Present Illness:    Maria Myers is a 57 y.o. female with past medical history significant for coronary artery disease.  In 2019 she came to the hospital because of chest pain.  She was found to have completely occluded LAD.  PTCA and stenting has been done.  Likely left ventricle ejection fraction was preserved.  Right coronary and simple complex were normal.  Also she was noted to have dyslipidemia and manage appropriately.  She requested to be seen because she started having some chest pain again this pain is located in the left side of her chest is not related to exercise.  She can walk climb stairs no problem however when she thinks about her children and she is very upset about some of the things they do for example they tried to date person said that she does not he is appropriate for them she does develop some chest pain.  She did not take nitroglycerin.  There is no dizziness no passing out.  Past Medical History:  Diagnosis Date   Abnormal stress test 01/04/2018   Stress echo poor exercise tolerance only 3 minutes and LAD ischemia with chest pain   Acquired hypothyroidism 09/06/2017   Acute viral syndrome 10/25/2018   Allergy    Angina pectoris ([place]) 01/04/2018   Annual physical exam 08/23/2015   Overview:  Last Assessment & Plan:  Counseled on health behaviors-encouraged to continue healthy diet, encouraged to add more exercise. Pap up-to-date, opts for mammogram screening every 2 years and this is up-to-date as well. Continue self breast exams monthly. Screening labs ordered. PIP joint pain-she is advised to try Voltaren as needed, if the pain worsens follow-up for further evaluation, no   Anxiety 10/31/2017   Chest pressure 07/23/2017    Coronary artery disease    01/07/18 PCI/DES x1, normal EF via LV gram.    Dyslipidemia 02/18/2019   Dysthymia 08/01/2017   Environmental and seasonal allergies 08/12/2014   Overview:  Last Assessment & Plan:  She wants to resume allergy injections, records requested. Last Assessment & Plan:  She wants to resume allergy injections, records requested.  Formatting of this note might be different from the original. Last Assessment & Plan:  She wants to resume allergy injections, records requested. Last Assessment & Plan:  Formatting of this note might be different from t   GERD (gastroesophageal reflux disease) 09/06/2017   History of viral illness 11/04/2018   Hypothyroidism 10/20/2011   Formatting of this note might be different from the original. Last Assessment & Plan:  She has not taken levothyroxine since I last saw her last year, she is using an [place] for thyroid disease, she would like to resume levothyroxine if her thyroid panel is not in the reference range on the supplement. Last Assessment & Plan:  Formatting of this note might be different from the original   Localized swelling, mass or lump of neck 08/01/2017   Mass of neck 08/01/2017   Seasonal allergic rhinitis due to pollen 07/23/2017   Status post coronary artery stent placement    Thyroid disease    Unstable angina ([place]) 01/07/2018   Vitamin D deficiency 09/06/2017    Past Surgical History:  Procedure Laterality Date   CESAREAN SECTION     CORONARY STENT INTERVENTION N/A 01/07/2018   Procedure: CORONARY STENT INTERVENTION;  Surgeon: Burnell Blanks, MD;  Location: [facility];  Service: Cardiovascular;  Laterality: N/A;   LEFT HEART CATH AND CORONARY ANGIOGRAPHY N/A 01/07/2018   Procedure: LEFT HEART CATH AND CORONARY ANGIOGRAPHY;  Surgeon: Burnell Blanks, MD;  Location: [facility];  Service: Cardiovascular;  Laterality: N/A;    Current Medications: Current Meds  Medication Sig   albuterol  (PROVENTIL HFA;VENTOLIN HFA) 108 (90 Base) MCG/ACT inhaler Inhale 2 puffs into the lungs every 4 (four) hours as needed for wheezing or shortness of breath.   aspirin EC 81 MG tablet Take 1 tablet (81 mg total) by mouth daily.   atorvastatin (LIPITOR) 40 MG tablet Take 1 tablet (40 mg total) by mouth daily. Patient needs an appointment for further refills. 3 rd/final attempt   fluticasone (FLONASE) 50 MCG/ACT nasal spray Place 2 sprays into both nostrils daily.   levothyroxine (SYNTHROID) 75 MCG tablet Take 1 tablet (75 mcg total) by mouth daily before breakfast.   nitroGLYCERIN (NITROSTAT) 0.4 MG SL tablet Place 0.4 mg under the tongue every 5 (five) minutes as needed for chest pain.   Vitamin D, Ergocalciferol, (DRISDOL) 1.25 MG (50000 UNIT) CAPS capsule TAKE 1 CAPSULE BY MOUTH 1 TIME A WEEK (Patient taking differently: Take 50,000 Units by mouth every 7 (seven) days. TAKE 1 CAPSULE BY MOUTH 1 TIME A WEEK)   [DISCONTINUED] metoprolol succinate (TOPROL-XL) 25 MG 24 hr tablet TAKE 1 TABLET(25 MG) BY MOUTH DAILY (Patient taking differently: Take 25 mg by mouth daily. TAKE 1 TABLET(25 MG) BY MOUTH DAILY)     Allergies:   Patient has no known allergies.   Social History   Socioeconomic History   Marital status: Married    Spouse name: Not on file   Number of children: Not on file   Years of education: Not on file   Highest education level: Not on file  Occupational History   Not on file  Tobacco Use   Smoking status: Never   Smokeless tobacco: Never  Vaping Use   Vaping Use: Never used  Substance and Sexual Activity   Alcohol use: Never   Drug use: No   Sexual activity: Not on file  Other Topics Concern   Not on file  Social History Narrative   Not on file   Social Determinants of Health   Financial Resource Strain: Not on file  Food Insecurity: Not on file  Transportation Needs: Not on file  Physical Activity: Not on file  Stress: Not on file  Social Connections: Not on file      Family History: The patient's family history includes Arthritis in her sister. ROS:   Please see the history of present illness.    All 14 point review of systems negative except as described per history of present illness  EKGs/Labs/Other Studies Reviewed:      Recent Labs: 05/15/2022: ALT 18; BUN 12; Creatinine, Ser 0.60; Hemoglobin 13.0; Platelets 207.0; Potassium 4.1; Sodium 141; TSH 1.12  Recent Lipid Panel    Component Value Date/Time   CHOL 124 04/25/2021 0840   CHOL 116 09/22/2020 1344   TRIG 104.0 04/25/2021 0840   HDL 51.60 04/25/2021 0840   HDL 53 09/22/2020 1344   CHOLHDL 2 04/25/2021 0840   VLDL 20.8 04/25/2021 0840   LDLCALC 52 04/25/2021 0840   LDLCALC 41 09/22/2020 1344  LDLDIRECT 46.0 05/15/2022 1338    Physical Exam:    VS:  BP (!) 88/60 (BP Location: Left Arm, Patient Position: Sitting)   Pulse 94   Ht 5' 2"$  (1.575 m)   Wt 159 lb (72.1 kg)   LMP  (LMP Unknown)   SpO2 96%   BMI 29.08 kg/m     Wt Readings from Last 3 Encounters:  07/11/22 159 lb (72.1 kg)  05/15/22 155 lb 9.6 oz (70.6 kg)  10/24/21 157 lb 6.4 oz (71.4 kg)     GEN:  Well nourished, well developed in no acute distress HEENT: Normal NECK: No JVD; No carotid bruits LYMPHATICS: No lymphadenopathy CARDIAC: RRR, no murmurs, no rubs, no gallops RESPIRATORY:  Clear to auscultation without rales, wheezing or rhonchi  ABDOMEN: Soft, non-tender, non-distended MUSCULOSKELETAL:  No edema; No deformity  SKIN: Warm and dry LOWER EXTREMITIES: no swelling NEUROLOGIC:  Alert and oriented x 3 PSYCHIATRIC:  Normal affect   ASSESSMENT:    1. Coronary artery disease involving native coronary artery of native heart without angina pectoris   2. Angina pectoris ([place])   3. Dyslipidemia   4. Status post coronary artery stent placement    PLAN:    In order of problems listed above:  Chest pain somewhat atypical, will ask her to have a Lexiscan to see if she get any inducible ischemia.   Will also stop her metoprolol since bradycardia and ask her to start taking ranolazine 500 mg twice daily. Dyslipidemia, I did review blood test done by primary care physician, her LDL from December 11 is 64 which is excellent.  She is taking high intense statin which I will continue. Status post coronary artery stent placement noted.  I will chaperone with Jacobo Forest my nurse to do entire visit Medication Adjustments/Labs and Tests Ordered: Current medicines are reviewed at length with the patient today.  Concerns regarding medicines are outlined above.  No orders of the defined types were placed in this encounter.  Medication changes: No orders of the defined types were placed in this encounter.   Signed, Park Liter, MD, [facility] 07/11/2022 9:49 AM    [place] physician for entire appt. Suann Larry. Kenton Kingfisher, RN

## 2022-07-13 ENCOUNTER — Telehealth (HOSPITAL_COMMUNITY): Payer: Self-pay | Admitting: *Deleted

## 2022-07-13 NOTE — Telephone Encounter (Signed)
Left message on voicemail per DPR in reference to upcoming appointment scheduled on 07/17/2022 at 8:00 with detailed instructions given per Myocardial Perfusion Study Information Sheet for the test. LM to arrive 15 minutes early, and that it is imperative to arrive on time for appointment to keep from having the test rescheduled. If you need to cancel or reschedule your appointment, please call the office within 24 hours of your appointment. Failure to do so may result in a cancellation of your appointment, and a $50 no show fee. Phone number given for call back for any questions.

## 2022-07-17 ENCOUNTER — Ambulatory Visit (HOSPITAL_COMMUNITY): Payer: Federal, State, Local not specified - PPO | Attending: Internal Medicine

## 2022-07-17 DIAGNOSIS — I209 Angina pectoris, unspecified: Secondary | ICD-10-CM | POA: Diagnosis not present

## 2022-07-17 DIAGNOSIS — Z955 Presence of coronary angioplasty implant and graft: Secondary | ICD-10-CM

## 2022-07-17 DIAGNOSIS — I2510 Atherosclerotic heart disease of native coronary artery without angina pectoris: Secondary | ICD-10-CM | POA: Diagnosis not present

## 2022-07-17 LAB — MYOCARDIAL PERFUSION IMAGING
LV dias vol: 70 mL (ref 46–106)
LV sys vol: 25 mL
Nuc Stress EF: 64 %
Peak HR: 88 {beats}/min
Rest HR: 45 {beats}/min
Rest Nuclear Isotope Dose: 10.3 mCi
SDS: 1
SRS: 0
SSS: 1
ST Depression (mm): 0 mm
Stress Nuclear Isotope Dose: 30.2 mCi
TID: 1.02

## 2022-07-17 MED ORDER — REGADENOSON 0.4 MG/5ML IV SOLN
0.4000 mg | Freq: Once | INTRAVENOUS | Status: AC
  Administered 2022-07-17: 0.4 mg via INTRAVENOUS

## 2022-07-17 MED ORDER — TECHNETIUM TC 99M TETROFOSMIN IV KIT
10.3000 | PACK | Freq: Once | INTRAVENOUS | Status: AC | PRN
  Administered 2022-07-17: 10.3 via INTRAVENOUS

## 2022-07-17 MED ORDER — TECHNETIUM TC 99M TETROFOSMIN IV KIT
30.2000 | PACK | Freq: Once | INTRAVENOUS | Status: AC | PRN
  Administered 2022-07-17: 30.2 via INTRAVENOUS

## 2022-07-24 ENCOUNTER — Telehealth: Payer: Self-pay

## 2022-07-24 NOTE — Telephone Encounter (Signed)
Results reviewed with pt as per Dr. Krasowski's note.  Pt verbalized understanding and had no additional questions. Routed to PCP  

## 2022-08-04 ENCOUNTER — Other Ambulatory Visit: Payer: Self-pay | Admitting: Family Medicine

## 2022-08-04 DIAGNOSIS — E039 Hypothyroidism, unspecified: Secondary | ICD-10-CM

## 2022-08-14 ENCOUNTER — Telehealth: Payer: Self-pay | Admitting: Cardiology

## 2022-08-14 NOTE — Telephone Encounter (Signed)
Pt c/o medication issue:  1. Name of Medication: atorvastatin (LIPITOR) 40 MG tablet  metoprolol succinate (TOPROL-XL) 25 MG 24 hr tablet  2. How are you currently taking this medication (dosage and times per day)? Take 1 tablet (40 mg total) by mouth daily. Patient needs an appointment for further refills. 3 rd/final attempt   Take 25 mg by mouth daily. TAKE 1 TABLET(25 MG) BY MOUTH DAILY  3. Are you having a reaction (difficulty breathing--STAT)? No  4. What is your medication issue? Pt would like a callback regarding above medications on what and when she should be taking it and which one was stopped because she thinks she's been taking the wrong one. Please advise.

## 2022-08-14 NOTE — Telephone Encounter (Signed)
Spoke with pt and verified medications. She had stopped the Atorvastatin and not the Metoprolol. As of today she will take the Atorvastatin and the Ranexa. Encouraged pt to call if she has any problems or concerns. Pt agreed and verbalized understanding.

## 2022-08-14 NOTE — Telephone Encounter (Signed)
Pt called asking about these medications. She states she has been taking the metoprolol, but informed her that was discontinued at her last appt and Renexa was added. She wants to know if she can take atorvastatin and metoprolol together. Please advise.

## 2022-10-01 ENCOUNTER — Other Ambulatory Visit: Payer: Self-pay | Admitting: Family Medicine

## 2022-10-01 DIAGNOSIS — E559 Vitamin D deficiency, unspecified: Secondary | ICD-10-CM

## 2022-10-09 ENCOUNTER — Encounter: Payer: Self-pay | Admitting: Cardiology

## 2022-10-09 ENCOUNTER — Ambulatory Visit: Payer: Federal, State, Local not specified - PPO | Attending: Cardiology | Admitting: Cardiology

## 2022-10-09 VITALS — BP 126/84 | HR 64 | Ht 62.0 in | Wt 160.0 lb

## 2022-10-09 DIAGNOSIS — E785 Hyperlipidemia, unspecified: Secondary | ICD-10-CM

## 2022-10-09 DIAGNOSIS — I25118 Atherosclerotic heart disease of native coronary artery with other forms of angina pectoris: Secondary | ICD-10-CM | POA: Diagnosis not present

## 2022-10-09 DIAGNOSIS — I209 Angina pectoris, unspecified: Secondary | ICD-10-CM

## 2022-10-09 MED ORDER — ISOSORBIDE MONONITRATE ER 30 MG PO TB24: 30.0000 mg | ORAL_TABLET | Freq: Every day | ORAL | 3 refills | Status: DC

## 2022-10-09 NOTE — Progress Notes (Signed)
Cardiology Office Note:    Date:  10/09/2022   ID:  Maria Myers, DOB Dec 20, 1965, MRN [id number]  PCP:  Mliss Sax, MD  Cardiologist:  Gypsy Balsam, MD    Referring MD: Mliss Sax,*   Chief Complaint  Patient presents with   Follow-up  Better  History of Present Illness:    Maria Myers is a 57 y.o. female past medical history significant for coronary disease.  In 2019 she came to hospital because of chest pain she was found to have a complete to the LAD that was addressed with PTCA and stenting done.  Left ventricle ejection fraction was preserved.  Since that time she seems to be doing well however lately she started complaining of having chest pain that happen only in stressful situations.  I tried to put her on ranolazine with partial relief of the pain but she started having stomach aches she had to stop ranolazine.  She can walk climb stairs no difficulties.  Stress testing repeated showed no evidence of ischemia  Past Medical History:  Diagnosis Date   Abnormal stress test 01/04/2018   Stress echo poor exercise tolerance only 3 minutes and LAD ischemia with chest pain   Acquired hypothyroidism 09/06/2017   Acute viral syndrome 10/25/2018   Allergy    Angina pectoris ([facility]) 01/04/2018   Annual physical exam 08/23/2015   Overview:  Last Assessment & Plan:  Counseled on health behaviors-encouraged to continue healthy diet, encouraged to add more exercise. Pap up-to-date, opts for mammogram screening every 2 years and this is up-to-date as well. Continue self breast exams monthly. Screening labs ordered. PIP joint pain-she is advised to try Voltaren as needed, if the pain worsens follow-up for further evaluation, no   Anxiety 10/31/2017   Chest pressure 07/23/2017   Coronary artery disease    01/07/18 PCI/DES x1, normal EF via LV gram.    Dyslipidemia 02/18/2019   Dysthymia 08/01/2017   Environmental and seasonal allergies 08/12/2014   Overview:  Last Assessment & Plan:   She wants to resume allergy injections, records requested. Last Assessment & Plan:  She wants to resume allergy injections, records requested.  Formatting of this note might be different from the original. Last Assessment & Plan:  She wants to resume allergy injections, records requested. Last Assessment & Plan:  Formatting of this note might be different from t   GERD (gastroesophageal reflux disease) 09/06/2017   History of viral illness 11/04/2018   Hypothyroidism 10/20/2011   Formatting of this note might be different from the original. Last Assessment & Plan:  She has not taken levothyroxine since I last saw her last year, she is using an [place] supplement for thyroid disease, she would like to resume levothyroxine if her thyroid panel is not in the reference range on the supplement. Last Assessment & Plan:  Formatting of this note might be different from the original   Localized swelling, mass or lump of neck 08/01/2017   Mass of neck 08/01/2017   Seasonal allergic rhinitis due to pollen 07/23/2017   Status post coronary artery stent placement    Thyroid disease    Unstable angina ([facility]) 01/07/2018   Vitamin D deficiency 09/06/2017    Past Surgical History:  Procedure Laterality Date   CESAREAN SECTION     CORONARY STENT INTERVENTION N/A 01/07/2018   Procedure: CORONARY STENT INTERVENTION;  Surgeon: Kathleene Hazel, MD;  Location: [facility];  Service: Cardiovascular;  Laterality: N/A;   LEFT HEART  CATH AND CORONARY ANGIOGRAPHY N/A 01/07/2018   Procedure: LEFT HEART CATH AND CORONARY ANGIOGRAPHY;  Surgeon: Kathleene Hazel, MD;  Location: [facility];  Service: Cardiovascular;  Laterality: N/A;    Current Medications: Current Meds  Medication Sig   albuterol (PROVENTIL HFA;VENTOLIN HFA) 108 (90 Base) MCG/ACT inhaler Inhale 2 puffs into the lungs every 4 (four) hours as needed for wheezing or shortness of breath.   aspirin EC 81 MG tablet Take 1 tablet (81 mg total) by  mouth daily.   atorvastatin (LIPITOR) 40 MG tablet Take 1 tablet (40 mg total) by mouth daily. Patient needs an appointment for further refills. 3 rd/final attempt   fluticasone (FLONASE) 50 MCG/ACT nasal spray Place 2 sprays into both nostrils daily.   levothyroxine (SYNTHROID) 75 MCG tablet TAKE 1 TABLET(75 MCG) BY MOUTH DAILY BEFORE BREAKFAST (Patient taking differently: Take 75 mcg by mouth daily before breakfast.)   nitroGLYCERIN (NITROSTAT) 0.4 MG SL tablet Place 0.4 mg under the tongue every 5 (five) minutes as needed for chest pain.   ranolazine (RANEXA) 500 MG 12 hr tablet Take 1 tablet (500 mg total) by mouth 2 (two) times daily. (Patient taking differently: Take 500 mg by mouth daily.)   Vitamin D, Ergocalciferol, (DRISDOL) 1.25 MG (50000 UNIT) CAPS capsule Take 1 capsule (50,000 Units total) by mouth every 7 (seven) days. TAKE 1 CAPSULE BY MOUTH 1 TIME A WEEK     Allergies:   Patient has no known allergies.   Social History   Socioeconomic History   Marital status: Married    Spouse name: Not on file   Number of children: Not on file   Years of education: Not on file   Highest education level: Not on file  Occupational History   Not on file  Tobacco Use   Smoking status: Never   Smokeless tobacco: Never  Vaping Use   Vaping Use: Never used  Substance and Sexual Activity   Alcohol use: Never   Drug use: No   Sexual activity: Not on file  Other Topics Concern   Not on file  Social History Narrative   Not on file   Social Determinants of Health   Financial Resource Strain: Not on file  Food Insecurity: Not on file  Transportation Needs: Not on file  Physical Activity: Not on file  Stress: Not on file  Social Connections: Not on file     Family History: The patient's family history includes Arthritis in her sister. ROS:   Please see the history of present illness.    All 14 point review of systems negative except as described per history of present  illness  EKGs/Labs/Other Studies Reviewed:      Recent Labs: 05/15/2022: ALT 18; BUN 12; Creatinine, Ser 0.60; Hemoglobin 13.0; Platelets 207.0; Potassium 4.1; Sodium 141; TSH 1.12  Recent Lipid Panel    Component Value Date/Time   CHOL 124 04/25/2021 0840   CHOL 116 09/22/2020 1344   TRIG 104.0 04/25/2021 0840   HDL 51.60 04/25/2021 0840   HDL 53 09/22/2020 1344   CHOLHDL 2 04/25/2021 0840   VLDL 20.8 04/25/2021 0840   LDLCALC 52 04/25/2021 0840   LDLCALC 41 09/22/2020 1344   LDLDIRECT 46.0 05/15/2022 1338    Physical Exam:    VS:  BP 126/84 (BP Location: Left Arm, Patient Position: Sitting)   Pulse 64   Ht 5\' 2"  (1.575 m)   Wt 160 lb (72.6 kg)   LMP  (LMP Unknown)  SpO2 98%   BMI 29.26 kg/m     Wt Readings from Last 3 Encounters:  10/09/22 160 lb (72.6 kg)  07/17/22 159 lb (72.1 kg)  07/11/22 159 lb (72.1 kg)     GEN:  Well nourished, well developed in no acute distress HEENT: Normal NECK: No JVD; No carotid bruits LYMPHATICS: No lymphadenopathy CARDIAC: RRR, no murmurs, no rubs, no gallops RESPIRATORY:  Clear to auscultation without rales, wheezing or rhonchi  ABDOMEN: Soft, non-tender, non-distended MUSCULOSKELETAL:  No edema; No deformity  SKIN: Warm and dry LOWER EXTREMITIES: no swelling NEUROLOGIC:  Alert and oriented x 3 PSYCHIATRIC:  Normal affect   ASSESSMENT:    1. Coronary artery disease of native artery of native heart with stable angina pectoris ([facility])   2. Angina pectoris ([facility])   3. Dyslipidemia    PLAN:    In order of problems listed above:  Coronary disease.  With some symptoms appearing stable.  I will start Imdur 30 mg daily continue rest of the medications.  Stress test reviewed negative. Dyslipidemia continue with statin, her fasting lipid profile show LDL 52 HDL 51 will continue present management. Angina pectoris stable pattern   Medication Adjustments/Labs and Tests Ordered: Current medicines are reviewed at length with the  patient today.  Concerns regarding medicines are outlined above.  No orders of the defined types were placed in this encounter.  Medication changes: No orders of the defined types were placed in this encounter.   Signed, Georgeanna Lea, MD, [facility] 10/09/2022 8:40 AM    [facility]

## 2022-10-09 NOTE — Addendum Note (Signed)
Addended by: Baldo Ash D on: 10/09/2022 08:56 AM   Modules accepted: Orders

## 2022-10-09 NOTE — Patient Instructions (Signed)
Medication Instructions:   Start: Imdur 30mg  1 tablet daily  STOP: Ranolazine   Lab Work: None Ordered If you have labs (blood work) drawn today and your tests are completely normal, you will receive your results only by: MyChart Message (if you have MyChart) OR A paper copy in the mail If you have any lab test that is abnormal or we need to change your treatment, we will call you to review the results.   Testing/Procedures: None Ordered   Follow-Up: At [facility], you and your health needs are our priority.  As part of our continuing mission to provide you with exceptional heart care, we have created designated Provider Care Teams.  These Care Teams include your primary Cardiologist (physician) and Advanced Practice Providers (APPs -  Physician Assistants and Nurse Practitioners) who all work together to provide you with the care you need, when you need it.  We recommend signing up for the patient portal called "MyChart".  Sign up information is provided on this After Visit Summary.  MyChart is used to connect with patients for Virtual Visits (Telemedicine).  Patients are able to view lab/test results, encounter notes, upcoming appointments, etc.  Non-urgent messages can be sent to your provider as well.   To learn more about what you can do with MyChart, go to [email or web address].    Your next appointment:   3 month(s)  The format for your next appointment:   In Person  Provider:   Gypsy Balsam, MD    Other Instructions NA

## 2023-01-09 ENCOUNTER — Ambulatory Visit: Payer: Federal, State, Local not specified - PPO | Attending: Cardiology | Admitting: Cardiology

## 2023-01-09 ENCOUNTER — Encounter: Payer: Self-pay | Admitting: Cardiology

## 2023-01-09 VITALS — BP 118/68 | HR 66 | Ht 61.5 in | Wt 159.0 lb

## 2023-01-09 DIAGNOSIS — I209 Angina pectoris, unspecified: Secondary | ICD-10-CM

## 2023-01-09 DIAGNOSIS — K219 Gastro-esophageal reflux disease without esophagitis: Secondary | ICD-10-CM | POA: Diagnosis not present

## 2023-01-09 DIAGNOSIS — E785 Hyperlipidemia, unspecified: Secondary | ICD-10-CM

## 2023-01-09 DIAGNOSIS — I25118 Atherosclerotic heart disease of native coronary artery with other forms of angina pectoris: Secondary | ICD-10-CM

## 2023-01-09 MED ORDER — ISOSORBIDE MONONITRATE ER 30 MG PO TB24: 30.0000 mg | ORAL_TABLET | Freq: Every day | ORAL | 3 refills | Status: DC

## 2023-01-09 NOTE — Patient Instructions (Signed)

## 2023-01-09 NOTE — Addendum Note (Signed)
Addended by: Baldo Ash D on: 01/09/2023 09:49 AM   Modules accepted: Orders

## 2023-01-09 NOTE — Progress Notes (Signed)
Cardiology Office Note:    Date:  01/09/2023   ID:  Maria Myers, DOB 1966/05/29, MRN [id number]  PCP:  Mliss Sax, MD  Cardiologist:  Gypsy Balsam, MD    Referring MD: Mliss Sax,*   Chief Complaint  Patient presents with   Follow-up    History of Present Illness:    Maria Myers is a 57 y.o. female with past medical history significant for coronary artery disease.  In 2019 she came to the hospital because of chest pain she was found to have completely occluded LAD, that was addressed with drug-eluting stent, left ventricle was preserved.  Since that time she was doing well however she presented to my office complaining of chest pain in stressful situations.  We did stress test which was negative in spite of that she was still having symptoms initially and tried to use ranolazine she was unable to tolerate it then I put her on Imdur 30 she comes today for follow-up she says she feels dramatically better.  She does not have any chest pain she can do what she wants no chest pain, tightness, pressure, burning in the chest.  Past Medical History:  Diagnosis Date   Abnormal stress test 01/04/2018   Stress echo poor exercise tolerance only 3 minutes and LAD ischemia with chest pain   Acquired hypothyroidism 09/06/2017   Acute viral syndrome 10/25/2018   Allergy    Angina pectoris (HCC) 01/04/2018   Annual physical exam 08/23/2015   Overview:  Last Assessment & Plan:  Counseled on health behaviors-encouraged to continue healthy diet, encouraged to add more exercise. Pap up-to-date, opts for mammogram screening every 2 years and this is up-to-date as well. Continue self breast exams monthly. Screening labs ordered. PIP joint pain-she is advised to try Voltaren as needed, if the pain worsens follow-up for further evaluation, no   Anxiety 10/31/2017   Chest pressure 07/23/2017   Coronary artery disease    01/07/18 PCI/DES x1, normal EF via LV gram.    Dyslipidemia 02/18/2019    Dysthymia 08/01/2017   Environmental and seasonal allergies 08/12/2014   Overview:  Last Assessment & Plan:  She wants to resume allergy injections, records requested. Last Assessment & Plan:  She wants to resume allergy injections, records requested.  Formatting of this note might be different from the original. Last Assessment & Plan:  She wants to resume allergy injections, records requested. Last Assessment & Plan:  Formatting of this note might be different from t   GERD (gastroesophageal reflux disease) 09/06/2017   History of viral illness 11/04/2018   Hypothyroidism 10/20/2011   Formatting of this note might be different from the original. Last Assessment & Plan:  She has not taken levothyroxine since I last saw her last year, she is using an [place] supplement for thyroid disease, she would like to resume levothyroxine if her thyroid panel is not in the reference range on the supplement. Last Assessment & Plan:  Formatting of this note might be different from the original   Localized swelling, mass or lump of neck 08/01/2017   Mass of neck 08/01/2017   Seasonal allergic rhinitis due to pollen 07/23/2017   Status post coronary artery stent placement    Thyroid disease    Unstable angina (HCC) 01/07/2018   Vitamin D deficiency 09/06/2017    Past Surgical History:  Procedure Laterality Date   CESAREAN SECTION     CORONARY STENT INTERVENTION N/A 01/07/2018   Procedure: CORONARY STENT INTERVENTION;  Surgeon:  Kathleene Hazel, MD;  Location: [facility];  Service: Cardiovascular;  Laterality: N/A;   LEFT HEART CATH AND CORONARY ANGIOGRAPHY N/A 01/07/2018   Procedure: LEFT HEART CATH AND CORONARY ANGIOGRAPHY;  Surgeon: Kathleene Hazel, MD;  Location: [facility];  Service: Cardiovascular;  Laterality: N/A;    Current Medications: Current Meds  Medication Sig   albuterol (PROVENTIL HFA;VENTOLIN HFA) 108 (90 Base) MCG/ACT inhaler Inhale 2 puffs into the lungs every 4 (four) hours  as needed for wheezing or shortness of breath.   aspirin EC 81 MG tablet Take 1 tablet (81 mg total) by mouth daily.   atorvastatin (LIPITOR) 40 MG tablet Take 1 tablet (40 mg total) by mouth daily. Patient needs an appointment for further refills. 3 rd/final attempt   fluticasone (FLONASE) 50 MCG/ACT nasal spray Place 2 sprays into both nostrils daily.   isosorbide mononitrate (IMDUR) 30 MG 24 hr tablet Take 1 tablet (30 mg total) by mouth daily.   levothyroxine (SYNTHROID) 75 MCG tablet TAKE 1 TABLET(75 MCG) BY MOUTH DAILY BEFORE BREAKFAST (Patient taking differently: Take 75 mcg by mouth daily before breakfast.)   nitroGLYCERIN (NITROSTAT) 0.4 MG SL tablet Place 0.4 mg under the tongue every 5 (five) minutes as needed for chest pain.   Vitamin D, Ergocalciferol, (DRISDOL) 1.25 MG (50000 UNIT) CAPS capsule Take 1 capsule (50,000 Units total) by mouth every 7 (seven) days. TAKE 1 CAPSULE BY MOUTH 1 TIME A WEEK     Allergies:   Patient has no known allergies.   Social History   Socioeconomic History   Marital status: Married    Spouse name: Not on file   Number of children: Not on file   Years of education: Not on file   Highest education level: Not on file  Occupational History   Not on file  Tobacco Use   Smoking status: Never   Smokeless tobacco: Never  Vaping Use   Vaping status: Never Used  Substance and Sexual Activity   Alcohol use: Never   Drug use: No   Sexual activity: Not on file  Other Topics Concern   Not on file  Social History Narrative   Not on file   Social Determinants of Health   Financial Resource Strain: Not on file  Food Insecurity: Not on file  Transportation Needs: Not on file  Physical Activity: Not on file  Stress: Not on file  Social Connections: Not on file     Family History: The patient's family history includes Arthritis in her sister. ROS:   Please see the history of present illness.    All 14 point review of systems negative except as  described per history of present illness  EKGs/Labs/Other Studies Reviewed:         Recent Labs: 05/15/2022: ALT 18; BUN 12; Creatinine, Ser 0.60; Hemoglobin 13.0; Platelets 207.0; Potassium 4.1; Sodium 141; TSH 1.12  Recent Lipid Panel    Component Value Date/Time   CHOL 124 04/25/2021 0840   CHOL 116 09/22/2020 1344   TRIG 104.0 04/25/2021 0840   HDL 51.60 04/25/2021 0840   HDL 53 09/22/2020 1344   CHOLHDL 2 04/25/2021 0840   VLDL 20.8 04/25/2021 0840   LDLCALC 52 04/25/2021 0840   LDLCALC 41 09/22/2020 1344   LDLDIRECT 46.0 05/15/2022 1338    Physical Exam:    VS:  BP 118/68 (BP Location: Left Arm, Patient Position: Sitting)   Pulse 66   Ht 5' 1.5" (1.562 m)  Wt 159 lb (72.1 kg)   LMP  (LMP Unknown)   SpO2 95%   BMI 29.56 kg/m     Wt Readings from Last 3 Encounters:  01/09/23 159 lb (72.1 kg)  10/09/22 160 lb (72.6 kg)  07/17/22 159 lb (72.1 kg)     GEN:  Well nourished, well developed in no acute distress HEENT: Normal NECK: No JVD; No carotid bruits LYMPHATICS: No lymphadenopathy CARDIAC: RRR, no murmurs, no rubs, no gallops RESPIRATORY:  Clear to auscultation without rales, wheezing or rhonchi  ABDOMEN: Soft, non-tender, non-distended MUSCULOSKELETAL:  No edema; No deformity  SKIN: Warm and dry LOWER EXTREMITIES: no swelling NEUROLOGIC:  Alert and oriented x 3 PSYCHIATRIC:  Normal affect   ASSESSMENT:    1. Angina pectoris (HCC)   2. Coronary artery disease of native artery of native heart with stable angina pectoris (HCC)   3. Gastroesophageal reflux disease, unspecified whether esophagitis present   4. Dyslipidemia    PLAN:    In order of problems listed above:  Angina pectoris denies having any stable right now continue antiplatelet therapy and antianginal therapy. Dyslipidemia she is on Lipitor 40 which I will continue I did review K PN which show me LDL 52 HDL 51 however this is from 1-1/2-year ago.  She does have appoint with her primary  care physician and she will have fasting lipid profile done. Gastroesophageal reflux disease.  Stable. Overall she is doing well continue present management   Medication Adjustments/Labs and Tests Ordered: Current medicines are reviewed at length with the patient today.  Concerns regarding medicines are outlined above.  No orders of the defined types were placed in this encounter.  Medication changes: No orders of the defined types were placed in this encounter.   Signed, Georgeanna Lea, MD, [facility] 01/09/2023 9:39 AM    [facility]

## 2023-02-10 ENCOUNTER — Other Ambulatory Visit: Payer: Self-pay | Admitting: Family Medicine

## 2023-02-10 DIAGNOSIS — E039 Hypothyroidism, unspecified: Secondary | ICD-10-CM

## 2023-02-16 ENCOUNTER — Ambulatory Visit (INDEPENDENT_AMBULATORY_CARE_PROVIDER_SITE_OTHER): Payer: Federal, State, Local not specified - PPO | Admitting: Family Medicine

## 2023-02-16 ENCOUNTER — Encounter: Payer: Self-pay | Admitting: Family Medicine

## 2023-02-16 VITALS — BP 112/68 | HR 56 | Temp 97.6°F | Ht 61.0 in | Wt 159.2 lb

## 2023-02-16 DIAGNOSIS — E039 Hypothyroidism, unspecified: Secondary | ICD-10-CM

## 2023-02-16 DIAGNOSIS — Z0000 Encounter for general adult medical examination without abnormal findings: Secondary | ICD-10-CM | POA: Diagnosis not present

## 2023-02-16 DIAGNOSIS — Z131 Encounter for screening for diabetes mellitus: Secondary | ICD-10-CM

## 2023-02-16 DIAGNOSIS — E559 Vitamin D deficiency, unspecified: Secondary | ICD-10-CM

## 2023-02-16 DIAGNOSIS — E7800 Pure hypercholesterolemia, unspecified: Secondary | ICD-10-CM

## 2023-02-16 MED ORDER — VITAMIN D (ERGOCALCIFEROL) 1.25 MG (50000 UNIT) PO CAPS
50000.0000 [IU] | ORAL_CAPSULE | ORAL | 2 refills | Status: DC
Start: 2023-02-16 — End: 2023-10-01

## 2023-02-16 NOTE — Progress Notes (Signed)
Established Patient Office Visit   Subjective:  Patient ID: Maria Myers, female    DOB: 15-Jan-1966  Age: 57 y.o. MRN: [id number]  Chief Complaint  Patient presents with   Annual Exam    CPE. Pt is fasting.     HPI Encounter Diagnoses  Name Primary?   Healthcare maintenance Yes   Vitamin D deficiency    Acquired hypothyroidism    Screening for diabetes mellitus    Elevated LDL cholesterol level    In for a physical and follow-up of above.  Continues 50,000 units of vitamin D weekly for D deficiency.  Atorvastatin 40 mg for elevated LDL cholesterol with a history of CAD.  She is taking 30 mg of Imdur daily and denies chest pain, chest pressure, DOE or shortness of breath.  She is exercising by walking 2-4 times weekly.  Continues levothyroxine 75 mcg each morning on a fasting stomach for hypothyroidism.  Up-to-date on health maintenance.   Review of Systems  Constitutional: Negative.   HENT: Negative.    Eyes:  Negative for blurred vision, discharge and redness.  Respiratory: Negative.  Negative for shortness of breath.   Cardiovascular: Negative.  Negative for chest pain.  Gastrointestinal:  Negative for abdominal pain.  Genitourinary: Negative.   Musculoskeletal: Negative.  Negative for myalgias.  Skin:  Negative for rash.  Neurological:  Negative for tingling, loss of consciousness and weakness.  Endo/Heme/Allergies:  Negative for polydipsia.     Current Outpatient Medications:    albuterol (PROVENTIL HFA;VENTOLIN HFA) 108 (90 Base) MCG/ACT inhaler, Inhale 2 puffs into the lungs every 4 (four) hours as needed for wheezing or shortness of breath., Disp: 1 Inhaler, Rfl: 1   aspirin EC 81 MG tablet, Take 1 tablet (81 mg total) by mouth daily., Disp: 90 tablet, Rfl: 3   atorvastatin (LIPITOR) 40 MG tablet, Take 1 tablet (40 mg total) by mouth daily. Patient needs an appointment for further refills. 3 rd/final attempt, Disp: 90 tablet, Rfl: 3   fluticasone (FLONASE) 50 MCG/ACT  nasal spray, Place 2 sprays into both nostrils daily., Disp: 16 g, Rfl: 6   isosorbide mononitrate (IMDUR) 30 MG 24 hr tablet, Take 1 tablet (30 mg total) by mouth daily., Disp: 90 tablet, Rfl: 3   levothyroxine (SYNTHROID) 75 MCG tablet, TAKE 1 TABLET(75 MCG) BY MOUTH DAILY BEFORE BREAKFAST, Disp: 90 tablet, Rfl: 0   nitroGLYCERIN (NITROSTAT) 0.4 MG SL tablet, Place 0.4 mg under the tongue every 5 (five) minutes as needed for chest pain., Disp: , Rfl:    Vitamin D, Ergocalciferol, (DRISDOL) 1.25 MG (50000 UNIT) CAPS capsule, Take 1 capsule (50,000 Units total) by mouth every 7 (seven) days. TAKE 1 CAPSULE BY MOUTH 1 TIME A WEEK, Disp: 12 capsule, Rfl: 2   Objective:     BP 112/68   Pulse (!) 56   Temp 97.6 F (36.4 C)   Ht 5\' 1"  (1.549 m)   Wt 159 lb 3.2 oz (72.2 kg)   LMP  (LMP Unknown)   SpO2 98%   BMI 30.08 kg/m    Physical Exam Constitutional:      General: She is not in acute distress.    Appearance: Normal appearance. She is not ill-appearing, toxic-appearing or diaphoretic.  HENT:     Head: Normocephalic and atraumatic.     Right Ear: Tympanic membrane, ear canal and external ear normal.     Left Ear: Tympanic membrane, ear canal and external ear normal.     Mouth/Throat:  Mouth: Mucous membranes are moist.     Pharynx: Oropharynx is clear. No oropharyngeal exudate or posterior oropharyngeal erythema.  Eyes:     General: No scleral icterus.       Right eye: No discharge.        Left eye: No discharge.     Extraocular Movements: Extraocular movements intact.     Conjunctiva/sclera: Conjunctivae normal.     Pupils: Pupils are equal, round, and reactive to light.  Cardiovascular:     Rate and Rhythm: Normal rate and regular rhythm.  Pulmonary:     Effort: Pulmonary effort is normal. No respiratory distress.     Breath sounds: Normal breath sounds. No wheezing or rhonchi.  Abdominal:     General: Bowel sounds are normal.  Musculoskeletal:     Cervical back: No  rigidity or tenderness.     Right lower leg: No edema.     Left lower leg: No edema.  Lymphadenopathy:     Cervical: No cervical adenopathy.  Skin:    General: Skin is warm and dry.  Neurological:     Mental Status: She is alert and oriented to person, place, and time.  Psychiatric:        Mood and Affect: Mood normal.        Behavior: Behavior normal.      No results found for any visits on 02/16/23.    The ASCVD Risk score (Arnett DK, et al., 2019) failed to calculate for the following reasons:   The valid total cholesterol range is 130 to 320 mg/dL    Assessment & Plan:   Healthcare maintenance  Vitamin D deficiency -     Vitamin D (Ergocalciferol); Take 1 capsule (50,000 Units total) by mouth every 7 (seven) days. TAKE 1 CAPSULE BY MOUTH 1 TIME A WEEK  Dispense: 12 capsule; Refill: 2 -     VITAMIN D 25 Hydroxy (Vit-D Deficiency, Fractures)  Acquired hypothyroidism -     CBC -     TSH  Screening for diabetes mellitus -     Comprehensive metabolic panel -     Hemoglobin A1c -     Urinalysis, Routine w reflex microscopic  Elevated LDL cholesterol level -     Comprehensive metabolic panel -     Lipid panel    Return in about 6 months (around 08/16/2023), or if symptoms worsen or fail to improve.  Continue all medications as indicated above.  Levothyroxine adjusted pending results of TSH.  She declines flu vaccine today.  Her estranged daughter has asked to return home.  Mliss Sax, MD

## 2023-02-16 NOTE — Addendum Note (Signed)
Addended by: Lindley Magnus L on: 02/16/2023 04:37 PM   Modules accepted: Orders

## 2023-02-17 LAB — LIPID PANEL
Chol/HDL Ratio: 2 ratio (ref 0.0–4.4)
Cholesterol, Total: 106 mg/dL (ref 100–199)
HDL: 53 mg/dL (ref >39)
LDL Chol Calc (NIH): 40 mg/dL (ref 0–99)
Triglycerides: 53 mg/dL (ref 0–149)
VLDL Cholesterol Cal: 13 mg/dL (ref 5–40)

## 2023-02-17 LAB — CBC
Hematocrit: 37.1 % (ref 34.0–46.6)
Hemoglobin: 12.3 g/dL (ref 11.1–15.9)
MCH: 28.1 pg (ref 26.6–33.0)
MCHC: 33.2 g/dL (ref 31.5–35.7)
MCV: 85 fL (ref 79–97)
Platelets: 174 10*3/uL (ref 150–450)
RBC: 4.37 x10E6/uL (ref 3.77–5.28)
RDW: 13.1 % (ref 11.7–15.4)
WBC: 5.2 10*3/uL (ref 3.4–10.8)

## 2023-02-17 LAB — COMPREHENSIVE METABOLIC PANEL
ALT: 19 IU/L (ref 0–32)
AST: 22 IU/L (ref 0–40)
Albumin: 4 g/dL (ref 3.8–4.9)
Alkaline Phosphatase: 92 IU/L (ref 44–121)
BUN/Creatinine Ratio: 16 (ref 9–23)
BUN: 11 mg/dL (ref 6–24)
Bilirubin Total: 0.3 mg/dL (ref 0.0–1.2)
CO2: 23 mmol/L (ref 20–29)
Calcium: 8.9 mg/dL (ref 8.7–10.2)
Chloride: 101 mmol/L (ref 96–106)
Creatinine, Ser: 0.67 mg/dL (ref 0.57–1.00)
Globulin, Total: 2.9 g/dL (ref 1.5–4.5)
Glucose: 84 mg/dL (ref 70–99)
Potassium: 3.9 mmol/L (ref 3.5–5.2)
Sodium: 137 mmol/L (ref 134–144)
Total Protein: 6.9 g/dL (ref 6.0–8.5)
eGFR: 102 mL/min/{1.73_m2} (ref >59)

## 2023-02-17 LAB — HEMOGLOBIN A1C
Est. average glucose Bld gHb Est-mCnc: 126 mg/dL
Hgb A1c MFr Bld: 6 % — ABNORMAL HIGH (ref 4.8–5.6)

## 2023-02-17 LAB — VITAMIN D 25 HYDROXY (VIT D DEFICIENCY, FRACTURES): Vit D, 25-Hydroxy: 38 ng/mL (ref 30.0–100.0)

## 2023-02-17 LAB — TSH: TSH: 1.15 u[IU]/mL (ref 0.450–4.500)

## 2023-02-26 ENCOUNTER — Telehealth: Payer: Self-pay | Admitting: Family Medicine

## 2023-02-26 DIAGNOSIS — Z23 Encounter for immunization: Secondary | ICD-10-CM

## 2023-02-26 DIAGNOSIS — Z1152 Encounter for screening for COVID-19: Secondary | ICD-10-CM

## 2023-02-26 NOTE — Telephone Encounter (Signed)
Pt is needing a cb concerning her most recent lab results. Please advise pt at [phone]

## 2023-02-27 NOTE — Telephone Encounter (Signed)
Pt checking on status of this message.

## 2023-02-28 NOTE — Telephone Encounter (Signed)
Results given. Pt expresses understanding. No further questions or concerns.   Scheduled for 1st Shingrix vaccine and the Mobile Mammo.

## 2023-03-01 NOTE — Addendum Note (Signed)
Addended by: Larey Dresser on: 03/01/2023 11:43 AM   Modules accepted: Orders

## 2023-03-06 ENCOUNTER — Ambulatory Visit: Payer: Federal, State, Local not specified - PPO

## 2023-04-03 ENCOUNTER — Other Ambulatory Visit: Payer: Self-pay | Admitting: Family Medicine

## 2023-04-03 DIAGNOSIS — Z1231 Encounter for screening mammogram for malignant neoplasm of breast: Secondary | ICD-10-CM

## 2023-04-04 ENCOUNTER — Ambulatory Visit
Admission: RE | Admit: 2023-04-04 | Discharge: 2023-04-04 | Disposition: A | Discharge status: Home / Self Care | Payer: Federal, State, Local not specified - PPO | Source: Ambulatory Visit | Attending: Family Medicine | Admitting: Family Medicine

## 2023-04-04 DIAGNOSIS — Z1231 Encounter for screening mammogram for malignant neoplasm of breast: Secondary | ICD-10-CM

## 2023-07-05 ENCOUNTER — Other Ambulatory Visit: Payer: Self-pay | Admitting: Family Medicine

## 2023-07-05 DIAGNOSIS — I2510 Atherosclerotic heart disease of native coronary artery without angina pectoris: Secondary | ICD-10-CM

## 2023-07-11 ENCOUNTER — Other Ambulatory Visit: Payer: Self-pay | Admitting: Cardiology

## 2023-09-10 ENCOUNTER — Encounter: Payer: Self-pay | Admitting: Family Medicine

## 2023-09-10 ENCOUNTER — Ambulatory Visit: Admitting: Family Medicine

## 2023-09-10 VITALS — BP 122/78 | HR 64 | Temp 98.0°F | Ht 61.0 in | Wt 161.2 lb

## 2023-09-10 DIAGNOSIS — E039 Hypothyroidism, unspecified: Secondary | ICD-10-CM | POA: Diagnosis not present

## 2023-09-10 DIAGNOSIS — I2510 Atherosclerotic heart disease of native coronary artery without angina pectoris: Secondary | ICD-10-CM

## 2023-09-10 DIAGNOSIS — E559 Vitamin D deficiency, unspecified: Secondary | ICD-10-CM

## 2023-09-10 DIAGNOSIS — R7303 Prediabetes: Secondary | ICD-10-CM

## 2023-09-10 LAB — LDL CHOLESTEROL, DIRECT: Direct LDL: 51 mg/dL

## 2023-09-10 LAB — BASIC METABOLIC PANEL WITH GFR
BUN: 12 mg/dL (ref 6–23)
CO2: 26 meq/L (ref 19–32)
Calcium: 8.9 mg/dL (ref 8.4–10.5)
Chloride: 105 meq/L (ref 96–112)
Creatinine, Ser: 0.61 mg/dL (ref 0.40–1.20)
GFR: 98.73 mL/min (ref >60.00)
Glucose, Bld: 92 mg/dL (ref 70–99)
Potassium: 4 meq/L (ref 3.5–5.1)
Sodium: 140 meq/L (ref 135–145)

## 2023-09-10 LAB — HEMOGLOBIN A1C: Hgb A1c MFr Bld: 6.2 % (ref 4.6–6.5)

## 2023-09-10 LAB — TSH: TSH: 1.28 u[IU]/mL (ref 0.35–5.50)

## 2023-09-10 LAB — VITAMIN D 25 HYDROXY (VIT D DEFICIENCY, FRACTURES): VITD: 24.35 ng/mL — ABNORMAL LOW (ref 30.00–100.00)

## 2023-09-10 NOTE — Progress Notes (Unsigned)
 Established Patient Office Visit   Subjective:  Patient ID: Maria Myers, female    DOB: 1965/12/21  Age: 58 y.o. MRN: [id number]  No chief complaint on file.   HPI Encounter Diagnoses  Name Primary?   Prediabetes Yes   Acquired hypothyroidism    Coronary artery disease involving native coronary artery of native heart without angina pectoris    Vitamin D deficiency    Doing well.  Continues on medications as above.  She had lost weight as advised but then it came back on when all of the kids were home back in December.  She is exercising by walking. {History (Optional):23778}  Review of Systems  Constitutional: Negative.   HENT: Negative.    Eyes:  Negative for blurred vision, discharge and redness.  Respiratory: Negative.    Cardiovascular: Negative.   Gastrointestinal:  Negative for abdominal pain.  Genitourinary: Negative.   Musculoskeletal: Negative.  Negative for myalgias.  Skin:  Negative for rash.  Neurological:  Negative for tingling, loss of consciousness and weakness.  Endo/Heme/Allergies:  Negative for polydipsia.     Current Outpatient Medications:    albuterol (PROVENTIL HFA;VENTOLIN HFA) 108 (90 Base) MCG/ACT inhaler, Inhale 2 puffs into the lungs every 4 (four) hours as needed for wheezing or shortness of breath., Disp: 1 Inhaler, Rfl: 1   aspirin EC 81 MG tablet, Take 1 tablet (81 mg total) by mouth daily., Disp: 90 tablet, Rfl: 3   atorvastatin (LIPITOR) 40 MG tablet, TAKE 1 TABLET(40 MG) BY MOUTH DAILY, Disp: 90 tablet, Rfl: 1   fluticasone (FLONASE) 50 MCG/ACT nasal spray, Place 2 sprays into both nostrils daily., Disp: 16 g, Rfl: 6   isosorbide mononitrate (IMDUR) 30 MG 24 hr tablet, TAKE 1 TABLET(30 MG) BY MOUTH DAILY, Disp: 90 tablet, Rfl: 3   levothyroxine (SYNTHROID) 75 MCG tablet, TAKE 1 TABLET(75 MCG) BY MOUTH DAILY BEFORE BREAKFAST, Disp: 90 tablet, Rfl: 0   nitroGLYCERIN (NITROSTAT) 0.4 MG SL tablet, Place 0.4 mg under the tongue every 5 (five)  minutes as needed for chest pain., Disp: , Rfl:    Vitamin D, Ergocalciferol, (DRISDOL) 1.25 MG (50000 UNIT) CAPS capsule, Take 1 capsule (50,000 Units total) by mouth every 7 (seven) days. TAKE 1 CAPSULE BY MOUTH 1 TIME A WEEK, Disp: 12 capsule, Rfl: 2   Objective:     BP 122/78 (BP Location: Left Arm, Patient Position: Sitting, Cuff Size: Normal)   Pulse 64   Temp 98 F (36.7 C)   Ht 5\' 1"  (1.549 m)   Wt 161 lb 3.2 oz (73.1 kg)   LMP  (LMP Unknown)   SpO2 96%   BMI 30.46 kg/m  Wt Readings from Last 3 Encounters:  09/10/23 161 lb 3.2 oz (73.1 kg)  02/16/23 159 lb 3.2 oz (72.2 kg)  01/09/23 159 lb (72.1 kg)      Physical Exam Constitutional:      General: She is not in acute distress.    Appearance: Normal appearance. She is not ill-appearing, toxic-appearing or diaphoretic.  HENT:     Head: Normocephalic and atraumatic.     Right Ear: External ear normal.     Left Ear: External ear normal.  Eyes:     General: No scleral icterus.       Right eye: No discharge.        Left eye: No discharge.     Extraocular Movements: Extraocular movements intact.     Conjunctiva/sclera: Conjunctivae normal.  Pulmonary:     Effort:  Pulmonary effort is normal. No respiratory distress.  Skin:    General: Skin is warm and dry.  Neurological:     Mental Status: She is alert and oriented to person, place, and time.  Psychiatric:        Mood and Affect: Mood normal.        Behavior: Behavior normal.      No results found for any visits on 09/10/23.  {Labs (Optional):23779}  The ASCVD Risk score (Arnett DK, et al., 2019) failed to calculate for the following reasons:   The valid total cholesterol range is 130 to 320 mg/dL    Assessment & Plan:   Prediabetes -     Basic metabolic panel with GFR -     Hemoglobin A1c  Acquired hypothyroidism -     TSH  Coronary artery disease involving native coronary artery of native heart without angina pectoris -     LDL cholesterol,  direct  Vitamin D deficiency -     VITAMIN D 25 Hydroxy (Vit-D Deficiency, Fractures)    Return in about 6 months (around 03/11/2024).  Encouraged weight loss and exercise for at least 150 minutes weekly.  Information was given on exercising to lose weight.  She will work on moderating carbohydrate intake and avoiding all sugary drinks.  We briefly discussed using metformin as a medication to prevent the onset of diabetes.  Mliss Sax, MD

## 2023-09-26 ENCOUNTER — Ambulatory Visit: Attending: Cardiology | Admitting: Cardiology

## 2023-09-30 ENCOUNTER — Other Ambulatory Visit: Payer: Self-pay | Admitting: Family Medicine

## 2023-09-30 DIAGNOSIS — E559 Vitamin D deficiency, unspecified: Secondary | ICD-10-CM

## 2023-10-05 ENCOUNTER — Ambulatory Visit: Admitting: Cardiology

## 2023-11-05 ENCOUNTER — Other Ambulatory Visit: Payer: Self-pay | Admitting: Family Medicine

## 2023-11-05 DIAGNOSIS — E039 Hypothyroidism, unspecified: Secondary | ICD-10-CM

## 2023-11-15 ENCOUNTER — Encounter: Payer: Self-pay | Admitting: Cardiology

## 2023-11-15 ENCOUNTER — Ambulatory Visit: Attending: Cardiology | Admitting: Cardiology

## 2023-11-15 VITALS — BP 128/68 | HR 80 | Ht 62.0 in | Wt 163.0 lb

## 2023-11-15 DIAGNOSIS — E785 Hyperlipidemia, unspecified: Secondary | ICD-10-CM | POA: Diagnosis not present

## 2023-11-15 DIAGNOSIS — I25118 Atherosclerotic heart disease of native coronary artery with other forms of angina pectoris: Secondary | ICD-10-CM

## 2023-11-15 DIAGNOSIS — I209 Angina pectoris, unspecified: Secondary | ICD-10-CM

## 2023-11-15 NOTE — Patient Instructions (Signed)
 Medication Instructions:  Your physician recommends that you continue on your current medications as directed. Please refer to the Current Medication list given to you today.  *If you need a refill on your cardiac medications before your next appointment, please call your pharmacy*  Lab Work: None If you have labs (blood work) drawn today and your tests are completely normal, you will receive your results only by: MyChart Message (if you have MyChart) OR A paper copy in the mail If you have any lab test that is abnormal or we need to change your treatment, we will call you to review the results.  Testing/Procedures: None  Follow-Up: At [facility], you and your health needs are our priority.  As part of our continuing mission to provide you with exceptional heart care, our providers are all part of one team.  This team includes your primary Cardiologist (physician) and Advanced Practice Providers or APPs (Physician Assistants and Nurse Practitioners) who all work together to provide you with the care you need, when you need it.  Your next appointment:   6 month(s)  Provider:  [facility] Ralene Burger, MD    We recommend signing up for the patient portal called MyChart.  Sign up information is provided on this After Visit Summary.  MyChart is used to connect with patients for Virtual Visits (Telemedicine).  Patients are able to view lab/test results, encounter notes, upcoming appointments, etc.  Non-urgent messages can be sent to your provider as well.   To learn more about what you can do with MyChart, go to [email or web address].   Other Instructions

## 2023-11-15 NOTE — Progress Notes (Signed)
 Cardiology Office Note:    Date:  11/15/2023   ID:  Maria Myers, DOB June 26, 1965, MRN [id number]  PCP:  Tonna Frederic, MD  Cardiologist:  Ralene Burger, MD    Referring MD: Tonna Frederic,*   Chief Complaint  Patient presents with   Follow-up    History of Present Illness:    Maria Myers is a 58 y.o. female past medical history significant for coronary artery disease in 2019 she came to the hospital because of chest pain she was found to have completely occluded LAD that was addressed with drug-eluting stent, left ventricular ejection fraction was preserved also dyslipidemia comes today to months for follow-up overall doing great.  He walks on the regular basis have no difficulty doing it denies have any chest pain tightness squeezing pressure burning chest no palpitation dizziness  Past Medical History:  Diagnosis Date   Abnormal stress test 01/04/2018   Stress echo poor exercise tolerance only 3 minutes and LAD ischemia with chest pain   Acquired hypothyroidism 09/06/2017   Acute viral syndrome 10/25/2018   Allergy    Angina pectoris (HCC) 01/04/2018   Annual physical exam 08/23/2015   Overview:  Last Assessment & Plan:  Counseled on health behaviors-encouraged to continue healthy diet, encouraged to add more exercise. Pap up-to-date, opts for mammogram screening every 2 years and this is up-to-date as well. Continue self breast exams monthly. Screening labs ordered. PIP joint pain-she is advised to try Voltaren as needed, if the pain worsens follow-up for further evaluation, no   Anxiety 10/31/2017   Chest pressure 07/23/2017   Coronary artery disease    01/07/18 PCI/DES x1, normal EF via LV gram.    Dyslipidemia 02/18/2019   Dysthymia 08/01/2017   Environmental and seasonal allergies 08/12/2014   Overview:  Last Assessment & Plan:  She wants to resume allergy injections, records requested. Last Assessment & Plan:  She wants to resume allergy injections, records requested.   Formatting of this note might be different from the original. Last Assessment & Plan:  She wants to resume allergy injections, records requested. Last Assessment & Plan:  Formatting of this note might be different from t   GERD (gastroesophageal reflux disease) 09/06/2017   History of viral illness 11/04/2018   Hypothyroidism 10/20/2011   Formatting of this note might be different from the original. Last Assessment & Plan:  She has not taken levothyroxine  since I last saw her last year, she is using an [place] supplement for thyroid  disease, she would like to resume levothyroxine  if her thyroid  panel is not in the reference range on the supplement. Last Assessment & Plan:  Formatting of this note might be different from the original   Localized swelling, mass or lump of neck 08/01/2017   Mass of neck 08/01/2017   Seasonal allergic rhinitis due to pollen 07/23/2017   Status post coronary artery stent placement    Thyroid  disease    Unstable angina (HCC) 01/07/2018   Vitamin D  deficiency 09/06/2017    Past Surgical History:  Procedure Laterality Date   CESAREAN SECTION     CORONARY STENT INTERVENTION N/A 01/07/2018   Procedure: CORONARY STENT INTERVENTION;  Surgeon: Odie Benne, MD;  Location: [facility];  Service: Cardiovascular;  Laterality: N/A;   LEFT HEART CATH AND CORONARY ANGIOGRAPHY N/A 01/07/2018   Procedure: LEFT HEART CATH AND CORONARY ANGIOGRAPHY;  Surgeon: Odie Benne, MD;  Location: [facility];  Service: Cardiovascular;  Laterality: N/A;  Current Medications: Current Meds  Medication Sig   albuterol  (PROVENTIL  HFA;VENTOLIN  HFA) 108 (90 Base) MCG/ACT inhaler Inhale 2 puffs into the lungs every 4 (four) hours as needed for wheezing or shortness of breath.   aspirin  EC 81 MG tablet Take 1 tablet (81 mg total) by mouth daily.   atorvastatin  (LIPITOR) 40 MG tablet TAKE 1 TABLET(40 MG) BY MOUTH DAILY (Patient taking differently: Take 40 mg by mouth daily.)    fluticasone  (FLONASE ) 50 MCG/ACT nasal spray Place 2 sprays into both nostrils daily.   isosorbide  mononitrate (IMDUR ) 30 MG 24 hr tablet TAKE 1 TABLET(30 MG) BY MOUTH DAILY (Patient taking differently: Take 30 mg by mouth daily.)   levothyroxine  (SYNTHROID ) 75 MCG tablet TAKE 1 TABLET(75 MCG) BY MOUTH DAILY BEFORE BREAKFAST (Patient taking differently: Take 75 mcg by mouth daily before breakfast.)   nitroGLYCERIN  (NITROSTAT ) 0.4 MG SL tablet Place 0.4 mg under the tongue every 5 (five) minutes as needed for chest pain.   Vitamin D , Ergocalciferol , (DRISDOL ) 1.25 MG (50000 UNIT) CAPS capsule TAKE 1 CAPSULE BY MOUTH EVERY 7 DAYS     Allergies:   Patient has no known allergies.   Social History   Socioeconomic History   Marital status: Married    Spouse name: Not on file   Number of children: Not on file   Years of education: Not on file   Highest education level: Not on file  Occupational History   Not on file  Tobacco Use   Smoking status: Never   Smokeless tobacco: Never  Vaping Use   Vaping status: Never Used  Substance and Sexual Activity   Alcohol use: Never   Drug use: No   Sexual activity: Not on file  Other Topics Concern   Not on file  Social History Narrative   Not on file   Social Drivers of Health   Financial Resource Strain: Not on file  Food Insecurity: Not on file  Transportation Needs: Not on file  Physical Activity: Not on file  Stress: Not on file  Social Connections: Not on file     Family History: The patient's family history includes Arthritis in her sister. ROS:   Please see the history of present illness.    All 14 point review of systems negative except as described per history of present illness  EKGs/Labs/Other Studies Reviewed:    EKG Interpretation Date/Time:  Thursday November 15 2023 16:03:47 EDT Ventricular Rate:  70 PR Interval:  148 QRS Duration:  96 QT Interval:  392 QTC Calculation: 423 R Axis:   -40  Text  Interpretation: Normal sinus rhythm Left axis deviation Incomplete right bundle branch block Septal infarct (cited on or before 08-Jan-2018) When compared with ECG of 08-Jan-2018 06:13, Questionable change in initial forces of Anterolateral leads T wave inversion no longer evident in Inferior leads T wave inversion no longer evident in Anterolateral leads Confirmed by Ralene Burger [phone]) on 11/15/2023 4:41:03 PM    Recent Labs: 02/16/2023: ALT 19; Hemoglobin 12.3; Platelets 174 09/10/2023: BUN 12; Creatinine, Ser 0.61; Potassium 4.0; Sodium 140; TSH 1.28  Recent Lipid Panel    Component Value Date/Time   CHOL 106 02/16/2023 1607   TRIG 53 02/16/2023 1607   HDL 53 02/16/2023 1607   CHOLHDL 2.0 02/16/2023 1607   CHOLHDL 2 04/25/2021 0840   VLDL 20.8 04/25/2021 0840   LDLCALC 40 02/16/2023 1607   LDLDIRECT 51.0 09/10/2023 1119    Physical Exam:    VS:  BP 128/68 (BP  Location: Right Arm, Patient Position: Sitting, Cuff Size: Normal)   Pulse 80   Ht 5' 2 (1.575 m)   Wt 163 lb (73.9 kg)   LMP  (LMP Unknown)   SpO2 96%   BMI 29.81 kg/m     Wt Readings from Last 3 Encounters:  11/15/23 163 lb (73.9 kg)  09/10/23 161 lb 3.2 oz (73.1 kg)  02/16/23 159 lb 3.2 oz (72.2 kg)     GEN:  Well nourished, well developed in no acute distress HEENT: Normal NECK: No JVD; No carotid bruits LYMPHATICS: No lymphadenopathy CARDIAC: RRR, no murmurs, no rubs, no gallops RESPIRATORY:  Clear to auscultation without rales, wheezing or rhonchi  ABDOMEN: Soft, non-tender, non-distended MUSCULOSKELETAL:  No edema; No deformity  SKIN: Warm and dry LOWER EXTREMITIES: no swelling NEUROLOGIC:  Alert and oriented x 3 PSYCHIATRIC:  Normal affect   ASSESSMENT:    1. Angina pectoris (HCC)   2. Coronary artery disease of native artery of native heart with stable angina pectoris (HCC)   3. Dyslipidemia    PLAN:    In order of problems listed above:  Coronary disease stable from that point review  denies have any chest pain on appropriate guideline directed medical therapy. Dyslipidemia stable on appropriate medication good cholesterol control continue. Angina pectoris denies having any   Medication Adjustments/Labs and Tests Ordered: Current medicines are reviewed at length with the patient today.  Concerns regarding medicines are outlined above.  Orders Placed This Encounter  Procedures   EKG 12-Lead   Medication changes: No orders of the defined types were placed in this encounter.   Signed, Manfred Seed, MD, [facility] 11/15/2023 5:21 PM    [facility]

## 2024-01-10 ENCOUNTER — Other Ambulatory Visit: Payer: Self-pay | Admitting: Family Medicine

## 2024-01-10 DIAGNOSIS — I2510 Atherosclerotic heart disease of native coronary artery without angina pectoris: Secondary | ICD-10-CM

## 2024-03-29 ENCOUNTER — Other Ambulatory Visit: Payer: Self-pay | Admitting: Family Medicine

## 2024-03-29 DIAGNOSIS — E559 Vitamin D deficiency, unspecified: Secondary | ICD-10-CM

## 2024-03-31 NOTE — Telephone Encounter (Signed)
 Pt needs an appt for med refill.

## 2024-04-17 ENCOUNTER — Other Ambulatory Visit: Payer: Self-pay | Admitting: Family Medicine

## 2024-04-17 DIAGNOSIS — Z1231 Encounter for screening mammogram for malignant neoplasm of breast: Secondary | ICD-10-CM

## 2024-04-21 ENCOUNTER — Inpatient Hospital Stay: Admission: RE | Admit: 2024-04-21 | Payer: Federal, State, Local not specified - PPO | Source: Ambulatory Visit

## 2024-07-04 ENCOUNTER — Other Ambulatory Visit: Payer: Self-pay | Admitting: Cardiology

## 2024-08-14 ENCOUNTER — Encounter: Admitting: Family Medicine
